# Patient Record
Sex: Male | Born: 1965
Health system: Southern US, Community
[De-identification: ages and names within clinical notes are randomized; demographics above are authoritative.]

## PROBLEM LIST (undated history)

## (undated) DIAGNOSIS — T8859XA Other complications of anesthesia, initial encounter: Secondary | ICD-10-CM

## (undated) DIAGNOSIS — K219 Gastro-esophageal reflux disease without esophagitis: Secondary | ICD-10-CM

## (undated) DIAGNOSIS — G4730 Sleep apnea, unspecified: Secondary | ICD-10-CM

## (undated) DIAGNOSIS — IMO0001 Reserved for inherently not codable concepts without codable children: Secondary | ICD-10-CM

## (undated) DIAGNOSIS — Z87442 Personal history of urinary calculi: Secondary | ICD-10-CM

## (undated) DIAGNOSIS — T4145XA Adverse effect of unspecified anesthetic, initial encounter: Secondary | ICD-10-CM

## (undated) DIAGNOSIS — K5792 Diverticulitis of intestine, part unspecified, without perforation or abscess without bleeding: Secondary | ICD-10-CM

## (undated) DIAGNOSIS — M1990 Unspecified osteoarthritis, unspecified site: Secondary | ICD-10-CM

## (undated) DIAGNOSIS — F419 Anxiety disorder, unspecified: Secondary | ICD-10-CM

## (undated) DIAGNOSIS — I10 Essential (primary) hypertension: Secondary | ICD-10-CM

## (undated) HISTORY — PX: BACK SURGERY: SHX140

## (undated) HISTORY — PX: COLON SURGERY: SHX602

## (undated) HISTORY — PX: KNEE SURGERY: SHX244

## (undated) HISTORY — PX: SHOULDER SURGERY: SHX246

## (undated) HISTORY — DX: Essential (primary) hypertension: I10

---

## 2011-06-19 ENCOUNTER — Observation Stay (HOSPITAL_COMMUNITY)
Admission: EM | Admit: 2011-06-19 | Discharge: 2011-06-20 | Disposition: A | Discharge status: Home / Self Care | Payer: 59 | Attending: Internal Medicine | Admitting: Internal Medicine

## 2011-06-19 ENCOUNTER — Emergency Department (HOSPITAL_COMMUNITY): Payer: 59

## 2011-06-19 ENCOUNTER — Other Ambulatory Visit: Payer: Self-pay

## 2011-06-19 ENCOUNTER — Encounter (HOSPITAL_COMMUNITY): Payer: Self-pay

## 2011-06-19 DIAGNOSIS — R079 Chest pain, unspecified: Secondary | ICD-10-CM

## 2011-06-19 DIAGNOSIS — K219 Gastro-esophageal reflux disease without esophagitis: Secondary | ICD-10-CM | POA: Insufficient documentation

## 2011-06-19 DIAGNOSIS — M25512 Pain in left shoulder: Secondary | ICD-10-CM | POA: Diagnosis present

## 2011-06-19 DIAGNOSIS — M25519 Pain in unspecified shoulder: Secondary | ICD-10-CM | POA: Insufficient documentation

## 2011-06-19 HISTORY — DX: Diverticulitis of intestine, part unspecified, without perforation or abscess without bleeding: K57.92

## 2011-06-19 HISTORY — DX: Other complications of anesthesia, initial encounter: T88.59XA

## 2011-06-19 HISTORY — DX: Adverse effect of unspecified anesthetic, initial encounter: T41.45XA

## 2011-06-19 HISTORY — DX: Reserved for inherently not codable concepts without codable children: IMO0001

## 2011-06-19 HISTORY — DX: Gastro-esophageal reflux disease without esophagitis: K21.9

## 2011-06-19 LAB — DIFFERENTIAL
Lymphs Abs: 1.7 10*3/uL (ref 0.7–4.0)
Monocytes Relative: 6 % (ref 3–12)
Neutro Abs: 6.4 10*3/uL (ref 1.7–7.7)
Neutrophils Relative %: 73 % (ref 43–77)

## 2011-06-19 LAB — LIPASE, BLOOD: Lipase: 26 U/L (ref 11–59)

## 2011-06-19 LAB — COMPREHENSIVE METABOLIC PANEL
ALT: 15 U/L (ref 0–53)
Albumin: 4.1 g/dL (ref 3.5–5.2)
Alkaline Phosphatase: 73 U/L (ref 39–117)
BUN: 23 mg/dL (ref 6–23)
Chloride: 103 mEq/L (ref 96–112)
GFR calc Af Amer: >90 mL/min (ref >90)
Glucose, Bld: 78 mg/dL (ref 70–99)
Potassium: 3.7 mEq/L (ref 3.5–5.1)
Sodium: 137 mEq/L (ref 135–145)
Total Bilirubin: 0.4 mg/dL (ref 0.3–1.2)

## 2011-06-19 LAB — CARDIAC PANEL(CRET KIN+CKTOT+MB+TROPI): Relative Index: INVALID (ref 0.0–2.5)

## 2011-06-19 LAB — CBC
Hemoglobin: 15.1 g/dL (ref 13.0–17.0)
RBC: 4.9 MIL/uL (ref 4.22–5.81)
WBC: 8.8 10*3/uL (ref 4.0–10.5)

## 2011-06-19 LAB — APTT: aPTT: 31 seconds (ref 24–37)

## 2011-06-19 LAB — PROTIME-INR: INR: 1 (ref 0.00–1.49)

## 2011-06-19 MED ORDER — HYDROCODONE-ACETAMINOPHEN 5-325 MG PO TABS
1.0000 | ORAL_TABLET | ORAL | Status: DC | PRN
  Administered 2011-06-19: 1 via ORAL
  Filled 2011-06-19: qty 1

## 2011-06-19 MED ORDER — SODIUM CHLORIDE 0.9 % IV SOLN
INTRAVENOUS | Status: DC
  Administered 2011-06-19: 20:00:00 via INTRAVENOUS

## 2011-06-19 MED ORDER — ASPIRIN 81 MG PO CHEW
324.0000 mg | CHEWABLE_TABLET | Freq: Once | ORAL | Status: AC
  Administered 2011-06-19: 324 mg via ORAL
  Filled 2011-06-19: qty 4

## 2011-06-19 MED ORDER — ASPIRIN 81 MG PO CHEW: 324.0000 mg | CHEWABLE_TABLET | Freq: Once | ORAL | Status: DC

## 2011-06-19 MED ORDER — NITROGLYCERIN 0.4 MG SL SUBL
0.4000 mg | SUBLINGUAL_TABLET | SUBLINGUAL | Status: DC | PRN
  Administered 2011-06-19 (×2): 0.4 mg via SUBLINGUAL
  Filled 2011-06-19: qty 75

## 2011-06-19 NOTE — ED Provider Notes (Signed)
History     CSN: [id number]  Arrival date & time 06/19/11  1814   First MD Initiated Contact with Patient 06/19/11 1902      Chief Complaint  Patient presents with  . Chest Pain    (Consider location/radiation/quality/duration/timing/severity/associated sxs/prior treatment) Patient is a 46 y.o. male presenting with chest pain. The history is provided by the patient.  Chest Pain Pertinent negatives for primary symptoms include no shortness of breath, no abdominal pain, no nausea and no vomiting.  Pertinent negatives for associated symptoms include no numbness and no weakness.   patient's had substernal to left sided chest pressure for the last 3 hours. Been constant. It began was at a meeting. No nausea vomiting. No fevers. No shortness of breath. His history of reflux, but that pain is not like that. His father began to have heart attacks in his 75s. He also has a brother that has had a heart attack. He also has left shoulder pain that has been chronic for a while. He states it was worse with trimming tree. Is not the same pain as the chest pain.  Past Medical History  Diagnosis Date  . Reflux   . Diverticulitis     Past Surgical History  Procedure Date  . Colon surgery   . Back surgery     No family history on file.  History  Substance Use Topics  . Smoking status: Never Smoker   . Smokeless tobacco: Not on file  . Alcohol Use: Yes      Review of Systems  Constitutional: Negative for activity change and appetite change.  HENT: Negative for neck stiffness.   Eyes: Negative for pain.  Respiratory: Negative for chest tightness and shortness of breath.   Cardiovascular: Positive for chest pain. Negative for leg swelling.  Gastrointestinal: Negative for nausea, vomiting, abdominal pain and diarrhea.  Genitourinary: Negative for flank pain.  Musculoskeletal: Negative for back pain.  Skin: Negative for rash.  Neurological: Negative for weakness, numbness and  headaches.  Psychiatric/Behavioral: Negative for behavioral problems.    Allergies  Review of patient's allergies indicates no known allergies.  Home Medications   Current Outpatient Rx  Name Route Sig Dispense Refill  . DIPHENHYDRAMINE HCL 25 MG PO TABS Oral Take 25 mg by mouth every 6 (six) hours as needed.    Marland Kitchen NAPROXEN SODIUM 220 MG PO TABS Oral Take 220 mg by mouth daily.      BP 110/76  Pulse 62  Temp(Src) 98.1 F (36.7 C) (Oral)  Resp 15  SpO2 97%  Physical Exam  Nursing note and vitals reviewed. Constitutional: He is oriented to person, place, and time. He appears well-developed and well-nourished.  HENT:  Head: Normocephalic and atraumatic.  Eyes: EOM are normal. Pupils are equal, round, and reactive to light.  Neck: Normal range of motion. Neck supple.  Cardiovascular: Normal rate, regular rhythm and normal heart sounds.   No murmur heard. Pulmonary/Chest: Effort normal and breath sounds normal.  Abdominal: Soft. Bowel sounds are normal. He exhibits no distension and no mass. There is no tenderness. There is no rebound and no guarding.  Musculoskeletal: Normal range of motion. He exhibits no edema.  Neurological: He is alert and oriented to person, place, and time. No cranial nerve deficit.  Skin: Skin is warm and dry.  Psychiatric: He has a normal mood and affect.    ED Course  Procedures (including critical care time)   Labs Reviewed  CBC  DIFFERENTIAL  COMPREHENSIVE METABOLIC PANEL  LIPASE, BLOOD  CARDIAC PANEL(CRET KIN+CKTOT+MB+TROPI)  PROTIME-INR  APTT   Dg Chest 2 View  06/19/2011  *RADIOLOGY REPORT*  Clinical Data: Pain for 1 week  CHEST - 2 VIEW  Comparison: None.  Findings:  The heart size and mediastinal contours are within normal limits.  Both lungs are clear.  The visualized skeletal structures are unremarkable except for mild thoracic scoliosis convex right.  IMPRESSION: No active cardiopulmonary disease.  Original Report Authenticated By:  Elsie Stain, M.D.   Dg Shoulder Left  06/19/2011  *RADIOLOGY REPORT*  Clinical Data: Pain for 1 week  LEFT SHOULDER - 2+ VIEW  Comparison:  None.  Findings:  There is no evidence of fracture or dislocation.  There is no evidence of arthropathy or other focal bone abnormality. Soft tissues are unremarkable.  IMPRESSION: Negative.  Original Report Authenticated By: Elsie Stain, M.D.     1. Chest pain      Date: 06/19/2011  Rate: 79  Rhythm: normal sinus rhythm  QRS Axis: normal  Intervals: normal  ST/T Wave abnormalities: normal  Conduction Disutrbances:none  Narrative Interpretation:   Old EKG Reviewed: none available    MDM  Chest pain.relieved by nitroglycerin. His father had his first heart attack in his 56s. EKG is reassuring. Enzymes are negative. Pain-free now. He'll be admitted to the hospital by internal medicine. I discussed initially with cardiology requested a medicine admission         Juliet Rude. Rubin Payor, MD 06/19/11 2147

## 2011-06-19 NOTE — Consult Note (Signed)
Referring Physician: ZOXWRUE Primary Physician: None Primary Cardiologist: None Reason for Consultation: Chest pain  HPI: 58 YOWM with a family history significant for premature CAD (dad with MI in 73s) who presents for evaluation of left sided chest pain and shoulder pain.  He reports 3-4 days of a dull ache over his left shoulder; this was exacerbated by yardwork this weekend.  Today, at 430pm he had 3 hours of left sided chest pain described as a dull ache of moderate intensity.  There were no associated signs or symptoms.  He went to urgent care and was give NTG which relieved his pain.  Currently, he is chest pain free.     Past Medical History  Diagnosis Date  . Reflux   . Diverticulitis     History   Social History  . Marital Status: Married    Spouse Name: N/A    Number of Children: N/A  . Years of Education: N/A   Occupational History  . Not on file.   Social History Main Topics  . Smoking status: Never Smoker   . Smokeless tobacco: Not on file  . Alcohol Use: Yes  . Drug Use: No  . Sexually Active:    Other Topics Concern  . Not on file   Social History Narrative  . No narrative on file    Family History  Problem Relation Age of Onset  . Breast cancer Mother   . Rectal cancer Mother   . Deep vein thrombosis Mother   . Stroke Father   . Heart disease Father 41  . Heart disease Brother 50    Medications Prior to Admission  Medication Dose Route Frequency Provider Last Rate Last Dose  . 0.9 %  sodium chloride infusion   Intravenous Continuous Juliet Rude. Rubin Payor, MD 125 mL/hr at 06/19/11 1939    . aspirin chewable tablet 324 mg  324 mg Oral Once [employer] Pickering, MD   324 mg at 06/19/11 1939  . HYDROcodone-acetaminophen (NORCO) 5-325 MG per tablet 1-2 tablet  1-2 tablet Oral Q4H PRN Therisa Doyne, MD   1 tablet at 06/19/11 2318  . nitroGLYCERIN (NITROSTAT) SL tablet 0.4 mg  0.4 mg Sublingual Q5 min PRN Juliet Rude. Pickering, MD   0.4 mg at 06/19/11  1945  . DISCONTD: aspirin chewable tablet 324 mg  324 mg Oral Once [employer]. Rubin Payor, MD       No current outpatient prescriptions on file as of 06/19/2011.    No Known Allergies  Review of Systems: All systems reviewed and are negative except as mentioned above in the history of present illness.    PHYSICAL EXAM: Filed Vitals:   06/19/11 2323  BP:   Pulse:   Temp: 97.7 F (36.5 C)  Resp:    GENERAL: No acute distress.   HEENT: Normocephalic, atraumatic.  Oropharynx is pink and moist without lesions.  NECK: Supple, no LAD, no JVD, no masses. CV: Regular rate and rhythm with no murmurs, rubs, or gallops.   LUNGS: Clear to auscultation bilaterally.   ABDOMEN: +BS, soft, nontender, nondistended.  EXTREMITIES: No clubbing, cyanosis, or edema.   NEURO: AO x 3, no focal deficits. PYSCH: Normal affect. SKIN: No rashes.     ECG: Sinus rhythm with normal STTW and no signs of ischemia.   Results for orders placed during the hospital encounter of 06/19/11 (from the past 24 hour(s))  CBC     Status: Normal   Collection Time   06/19/11  7:13 PM  Component Value Range   WBC 8.8  4.0 - 10.5 (K/uL)   RBC 4.90  4.22 - 5.81 (MIL/uL)   Hemoglobin 15.1  13.0 - 17.0 (g/dL)   HCT 40.9  81.1 - 91.4 (%)   MCV 89.0  78.0 - 100.0 (fL)   MCH 30.8  26.0 - 34.0 (pg)   MCHC 34.6  30.0 - 36.0 (g/dL)   RDW 78.2  95.6 - 21.3 (%)   Platelets 230  150 - 400 (K/uL)  DIFFERENTIAL     Status: Normal   Collection Time   06/19/11  7:13 PM      Component Value Range   Neutrophils Relative 73  43 - 77 (%)   Neutro Abs 6.4  1.7 - 7.7 (K/uL)   Lymphocytes Relative 20  12 - 46 (%)   Lymphs Abs 1.7  0.7 - 4.0 (K/uL)   Monocytes Relative 6  3 - 12 (%)   Monocytes Absolute 0.5  0.1 - 1.0 (K/uL)   Eosinophils Relative 1  0 - 5 (%)   Eosinophils Absolute 0.1  0.0 - 0.7 (K/uL)   Basophils Relative 0  0 - 1 (%)   Basophils Absolute 0.0  0.0 - 0.1 (K/uL)  COMPREHENSIVE METABOLIC PANEL     Status: Normal     Collection Time   06/19/11  7:13 PM      Component Value Range   Sodium 137  135 - 145 (mEq/L)   Potassium 3.7  3.5 - 5.1 (mEq/L)   Chloride 103  96 - 112 (mEq/L)   CO2 23  19 - 32 (mEq/L)   Glucose, Bld 78  70 - 99 (mg/dL)   BUN 23  6 - 23 (mg/dL)   Creatinine, Ser 0.86  0.50 - 1.35 (mg/dL)   Calcium 9.3  8.4 - 57.8 (mg/dL)   Total Protein 7.1  6.0 - 8.3 (g/dL)   Albumin 4.1  3.5 - 5.2 (g/dL)   AST 12  0 - 37 (U/L)   ALT 15  0 - 53 (U/L)   Alkaline Phosphatase 73  39 - 117 (U/L)   Total Bilirubin 0.4  0.3 - 1.2 (mg/dL)   GFR calc non Af Amer >90  >90 (mL/min)   GFR calc Af Amer >90  >90 (mL/min)  LIPASE, BLOOD     Status: Normal   Collection Time   06/19/11  7:13 PM      Component Value Range   Lipase 26  11 - 59 (U/L)  CARDIAC PANEL(CRET KIN+CKTOT+MB+TROPI)     Status: Normal   Collection Time   06/19/11  7:13 PM      Component Value Range   Total CK 58  7 - 232 (U/L)   CK, MB 1.7  0.3 - 4.0 (ng/mL)   Troponin I <0.30  <0.30 (ng/mL)   Relative Index RELATIVE INDEX IS INVALID  0.0 - 2.5   PROTIME-INR     Status: Normal   Collection Time   06/19/11  7:13 PM      Component Value Range   Prothrombin Time 13.4  11.6 - 15.2 (seconds)   INR 1.00  0.00 - 1.49   APTT     Status: Normal   Collection Time   06/19/11  7:13 PM      Component Value Range   aPTT 31  24 - 37 (seconds)   Dg Chest 2 View  06/19/2011  *RADIOLOGY REPORT*  Clinical Data: Pain for 1 week  CHEST - 2 VIEW  Comparison: None.  Findings:  The heart size and mediastinal contours are within normal limits.  Both lungs are clear.  The visualized skeletal structures are unremarkable except for mild thoracic scoliosis convex right.  IMPRESSION: No active cardiopulmonary disease.  Original Report Authenticated By: Elsie Stain, M.D.   Dg Shoulder Left  06/19/2011  *RADIOLOGY REPORT*  Clinical Data: Pain for 1 week  LEFT SHOULDER - 2+ VIEW  Comparison:  None.  Findings:  There is no evidence of fracture or  dislocation.  There is no evidence of arthropathy or other focal bone abnormality. Soft tissues are unremarkable.  IMPRESSION: Negative.  Original Report Authenticated By: Elsie Stain, M.D.   ASSESSMENT and PLAN:  34 YOWM with a family history significant for premature CAD (dad with MI in 83s) who presents for evaluation of left sided chest pain and shoulder pain and was found to have a normal EKG and normal cardiac enzymes times one set.  1: Chest pain - his history is atypical for ACS.  Other etiologies included GERD, esophageal spasm, chest wall pain or stress.  Recommend tele monitoring and rule out with cardiac enzymes.  Given his family history, recommend exercise nuclear stress test in the morning prior to discharge.  NPO after midnight.  Check lipid panel.  Continue aspirin.   2: Shoulder pain - this seems musculoskeletal in etiology.  Continue empiric NSAIDS.  Consider MRI as outpatient if this doesn't improve.   3: [facility] will follow in consultation  Gwendalyn Ege, Cardiology  Level 5 consult

## 2011-06-19 NOTE — ED Notes (Signed)
Pt reports decrease in pain from 3 to 0 after nitro SL x 2. Plan of care is updated with verbal understanding, VSS, INAD, MAE x4, skin w/d and resp e/u. Will continue to monitor pt, pt is awaiting test results for disposition.

## 2011-06-19 NOTE — H&P (Signed)
PCP:  Originally form [place] no PCP   Chief Complaint:   Chest pain  HPI: Andrew Hooper is a 46 y.o. male   has a past medical history of Reflux and Diverticulitis.   Presented with   Chest pain started at 4:30 pm while in the meeting and was referred to the ER from urgent care. Pain felt dull achy right under left chest area.  Pain relieved with nitro. No SOB, felt a bit flushed. No nausea, no presyncope, no diaphoresis. Some extra stress lately. No strenuous activity. Have been on low carb diet and lost 50 lb. Had negative stress test 5 years ago. Of note he has history of Left Shoulder pain that gets worse occasionally but has been bothering him on and off for past 2 years. For past week that Pain was worse with movement. He is concerned as his grand-father had RA.  Review of Systems:    Pertinent positives include:headaches, chest pain  Constitutional:  No weight loss, night sweats, Fevers, chills, fatigue.  HEENT:  No  Difficulty swallowing,Tooth/dental problems,Sore throat,  No sneezing, itching, ear ache, nasal congestion, post nasal drip,  Cardio-vascular:  No , Orthopnea, PND, anasarca, dizziness, palpitations.no Bilateral lower extremity swelling  GI:  No heartburn, indigestion, abdominal pain, nausea, vomiting, diarrhea, change in bowel habits, loss of appetite, melena, blood in stool, hematoemesis Resp:  no shortness of breath at rest. No dyspnea on exertion, No excess mucus, no productive cough, No non-productive cough, No coughing up of blood.No change in color of mucus.No wheezing.No chest wall deformity  Skin:  no rash or lesions.  GU:  no dysuria, change in color of urine, no urgency or frequency. No flank pain.  Musculoskeletal:  No joint pain or swelling. No decreased range of motion. No back pain.  Psych:  No change in mood or affect. No depression or anxiety. No memory loss.  Neuro: no localizing neurological complaints, no tingling, no weakness, no  double vision, no gait abnormality, no slurred speech   Otherwise ROS are negative except for above, 10 systems were reviewed  Past Medical History: Past Medical History  Diagnosis Date  . Reflux   . Diverticulitis    Past Surgical History  Procedure Date  . Colon surgery   . Back surgery      Medications: Prior to Admission medications   Medication Sig Start Date End Date Taking? Authorizing Provider  diphenhydrAMINE (BENADRYL) 25 MG tablet Take 25 mg by mouth every 6 (six) hours as needed.   Yes Historical Provider, MD  naproxen sodium (ANAPROX) 220 MG tablet Take 220 mg by mouth daily.   Yes Historical Provider, MD    Allergies:  No Known Allergies  Social History:  Ambulatory independently  Lives at home   reports that he has never smoked. He does not have any smokeless tobacco history on file. He reports that he drinks alcohol. He reports that he does not use illicit drugs.   Family History: family history includes Breast cancer in his mother; Deep vein thrombosis in his mother; Heart disease (age of onset:40) in his father; Heart disease (age of onset:50) in his brother; Rectal cancer in his mother; and Stroke in his father.    Physical Exam: Patient Vitals for the past 24 hrs:  BP Temp Temp src Pulse Resp SpO2  06/19/11 2112 110/76 mmHg - - 62  15  97 %  06/19/11 1943 115/69 mmHg - - 79  14  96 %  06/19/11 1930 130/83 mmHg - -  70  15  97 %  06/19/11 1823 - 98.1 F (36.7 C) Oral - - -  06/19/11 1821 127/87 mmHg - - 83  18  99 %    1. General:  in No Acute distress 2. Psychological: Alert and Oriented 3. Head/ENT:   Moist  Mucous Membranes                          Head Non traumatic, neck supple                          Normal  Dentition 4. SKIN: normalSkin turgor,  Skin clean Dry and intact no rash 5. Heart: Regular rate and rhythm no Murmur, Rub or gallop 6. Lungs: Clear to auscultation bilaterally, no wheezes or crackles   7. Abdomen: Soft, non-tender,  Non distended 8. Lower extremities: no clubbing, cyanosis, or edema 9. Neurologically Grossly intact, moving all 4 extremities equally 10. MSK: Normal range of motion, no pain with movement of left shoulder  body mass index is unknown because there is no height or weight on file.   Labs on Admission:   [facility] 06/19/11 1913  NA 137  K 3.7  CL 103  CO2 23  GLUCOSE 78  BUN 23  CREATININE 0.87  CALCIUM 9.3  MG --  PHOS --    Basename 06/19/11 1913  AST 12  ALT 15  ALKPHOS 73  BILITOT 0.4  PROT 7.1  ALBUMIN 4.1    Basename 06/19/11 1913  LIPASE 26  AMYLASE --    Basename 06/19/11 1913  WBC 8.8  NEUTROABS 6.4  HGB 15.1  HCT 43.6  MCV 89.0  PLT 230    Basename 06/19/11 1913  CKTOTAL 58  CKMB 1.7  CKMBINDEX --  TROPONINI <0.30   No results found for this basename: TSH,T4TOTAL,FREET3,T3FREE,THYROIDAB in the last 72 hours No results found for this basename: VITAMINB12:2,FOLATE:2,FERRITIN:2,TIBC:2,IRON:2,RETICCTPCT:2 in the last 72 hours No results found for this basename: HGBA1C    CrCl is unknown because there is no height on file for the current visit. ABG No results found for this basename: phart, pco2, po2, hco3, tco2, acidbasedef, o2sat     No results found for this basename: DDIMER     Other results: Lipase 26  I have pearsonaly reviewed this: ECG REPORT  Rate:79  Rhythm: NSR ST&T Change: none       Radiological Exams on Admission: Dg Chest 2 View  06/19/2011  *RADIOLOGY REPORT*  Clinical Data: Pain for 1 week  CHEST - 2 VIEW  Comparison: None.  Findings:  The heart size and mediastinal contours are within normal limits.  Both lungs are clear.  The visualized skeletal structures are unremarkable except for mild thoracic scoliosis convex right.  IMPRESSION: No active cardiopulmonary disease.  Original Report Authenticated By: Elsie Stain, M.D.   Dg Shoulder Left  06/19/2011  *RADIOLOGY REPORT*  Clinical Data: Pain for 1 week  LEFT  SHOULDER - 2+ VIEW  Comparison:  None.  Findings:  There is no evidence of fracture or dislocation.  There is no evidence of arthropathy or other focal bone abnormality. Soft tissues are unremarkable.  IMPRESSION: Negative.  Original Report Authenticated By: Elsie Stain, M.D.    Assessment/Plan  46 year old gentleman with history of GERD and family history of early onset heart disease here with chest pain relieved by nitroglycerin.  Present on Admission:  .Chest pain -  given  risk factors will admit, monitor on telemetry, cycle cardiac enzymes, obtain serial ECG. Further risk stratify with lipid panel, hgA1C, obtain TSH. Make sure patient is on Aspirin. Further treatment based on the currently pending results.  Given risk factors would notify cardiology to see patient as a consult, have spoken to [facility] cardiology. Will make him NPO for possible stress test in AM. .Left shoulder pain - imaging unremarkable. Pain control for now. This could be followed up as an outpatient.   Prophylaxis:  Lovenox, Protonix  CODE STATUS: FULL CODE   Shawntell Dixson 06/19/2011, 10:17 PM

## 2011-06-19 NOTE — ED Notes (Signed)
Admitting MD at bedside, pt awaiting inpt beds assignment.  

## 2011-06-19 NOTE — ED Notes (Signed)
Pt presents with onset of L sided chest pain that began today while seated in a meeting.  Pt reports L shoulder pain since the weekend after trimming trees, but reports shoulder pain before he began trimming.  Pt denies any shortness of breath or nausea.  Pt reports h/o reflux, but pain is in epigastric area.  Father began to have MIs in his 9s.

## 2011-06-20 ENCOUNTER — Other Ambulatory Visit: Payer: Self-pay

## 2011-06-20 ENCOUNTER — Encounter (HOSPITAL_COMMUNITY): Payer: Self-pay | Admitting: General Practice

## 2011-06-20 DIAGNOSIS — R079 Chest pain, unspecified: Secondary | ICD-10-CM

## 2011-06-20 LAB — COMPREHENSIVE METABOLIC PANEL
BUN: 24 mg/dL — ABNORMAL HIGH (ref 6–23)
CO2: 27 mEq/L (ref 19–32)
Calcium: 9.1 mg/dL (ref 8.4–10.5)
Creatinine, Ser: 0.93 mg/dL (ref 0.50–1.35)
GFR calc Af Amer: >90 mL/min (ref >90)
GFR calc non Af Amer: >90 mL/min (ref >90)
Glucose, Bld: 79 mg/dL (ref 70–99)

## 2011-06-20 LAB — CARDIAC PANEL(CRET KIN+CKTOT+MB+TROPI)
CK, MB: 1.6 ng/mL (ref 0.3–4.0)
Relative Index: INVALID (ref 0.0–2.5)
Relative Index: INVALID (ref 0.0–2.5)
Total CK: 52 U/L (ref 7–232)
Total CK: 48 U/L (ref 7–232)
Total CK: 48 U/L (ref 7–232)
Troponin I: <0.3 ng/mL (ref <0.30)

## 2011-06-20 LAB — LIPID PANEL
Cholesterol: 143 mg/dL (ref 0–200)
LDL Cholesterol: 88 mg/dL (ref 0–99)
Triglycerides: 106 mg/dL (ref <150)

## 2011-06-20 LAB — CBC
Hemoglobin: 14.1 g/dL (ref 13.0–17.0)
RBC: 4.68 MIL/uL (ref 4.22–5.81)

## 2011-06-20 LAB — HEMOGLOBIN A1C: Hgb A1c MFr Bld: 5.3 % (ref <5.7)

## 2011-06-20 MED ORDER — PANTOPRAZOLE SODIUM 40 MG PO TBEC: 40.0000 mg | DELAYED_RELEASE_TABLET | Freq: Every day | ORAL | Status: DC

## 2011-06-20 MED ORDER — PANTOPRAZOLE SODIUM 40 MG PO TBEC
40.0000 mg | DELAYED_RELEASE_TABLET | Freq: Every day | ORAL | Status: DC
  Administered 2011-06-20: 40 mg via ORAL
  Filled 2011-06-20: qty 1

## 2011-06-20 MED ORDER — ALBUTEROL SULFATE (5 MG/ML) 0.5% IN NEBU: 2.5000 mg | INHALATION_SOLUTION | RESPIRATORY_TRACT | Status: DC | PRN

## 2011-06-20 MED ORDER — ASPIRIN 81 MG PO TBEC: 81.0000 mg | DELAYED_RELEASE_TABLET | Freq: Every day | ORAL | Status: AC

## 2011-06-20 MED ORDER — ONDANSETRON HCL 4 MG/2ML IJ SOLN: 4.0000 mg | Freq: Four times a day (QID) | INTRAMUSCULAR | Status: DC | PRN

## 2011-06-20 MED ORDER — GUAIFENESIN-DM 100-10 MG/5ML PO SYRP: 5.0000 mL | ORAL_SOLUTION | ORAL | Status: DC | PRN

## 2011-06-20 MED ORDER — NITROGLYCERIN 0.4 MG SL SUBL: 0.4000 mg | SUBLINGUAL_TABLET | SUBLINGUAL | Status: DC | PRN

## 2011-06-20 MED ORDER — ASPIRIN EC 81 MG PO TBEC
81.0000 mg | DELAYED_RELEASE_TABLET | Freq: Every day | ORAL | Status: DC
  Administered 2011-06-20: 81 mg via ORAL
  Filled 2011-06-20: qty 1

## 2011-06-20 MED ORDER — ACETAMINOPHEN 325 MG PO TABS: 650.0000 mg | ORAL_TABLET | Freq: Four times a day (QID) | ORAL | Status: DC | PRN

## 2011-06-20 MED ORDER — ONDANSETRON HCL 4 MG PO TABS: 4.0000 mg | ORAL_TABLET | Freq: Four times a day (QID) | ORAL | Status: DC | PRN

## 2011-06-20 MED ORDER — SODIUM CHLORIDE 0.9 % IV SOLN
INTRAVENOUS | Status: DC
  Administered 2011-06-20 (×2): via INTRAVENOUS

## 2011-06-20 MED ORDER — HYDROCODONE-ACETAMINOPHEN 5-325 MG PO TABS: 1.0000 | ORAL_TABLET | Freq: Four times a day (QID) | ORAL | Status: AC | PRN

## 2011-06-20 MED ORDER — ACETAMINOPHEN 650 MG RE SUPP: 650.0000 mg | Freq: Four times a day (QID) | RECTAL | Status: DC | PRN

## 2011-06-20 MED ORDER — ALUM & MAG HYDROXIDE-SIMETH 200-200-20 MG/5ML PO SUSP
30.0000 mL | Freq: Four times a day (QID) | ORAL | Status: DC | PRN
Start: 2011-06-20 — End: 2011-06-20

## 2011-06-20 MED ORDER — ENOXAPARIN SODIUM 40 MG/0.4ML ~~LOC~~ SOLN
40.0000 mg | SUBCUTANEOUS | Status: DC
  Administered 2011-06-20: 40 mg via SUBCUTANEOUS
  Filled 2011-06-20: qty 0.4

## 2011-06-20 MED ORDER — MORPHINE SULFATE 2 MG/ML IJ SOLN: 2.0000 mg | INTRAMUSCULAR | Status: DC | PRN

## 2011-06-20 NOTE — Discharge Summary (Signed)
Physician Discharge Summary  Patient ID: Andrew Hooper MRN: [id number] DOB/AGE: 1965-06-21 46 y.o.  Admit date: 06/19/2011 Discharge date: 06/20/2011  Primary Care Physician:  No primary provider on file.  Discharge Diagnoses:   Present on Admission:  .Chest pain with typical and atypical features : Resolved  .Left shoulder pain: Resolved GERD  Consults:  [facility], Sharrell Ku, M.D.   Discharge Medications: Current Discharge Medication List    START taking these medications   Details  aspirin EC 81 MG EC tablet Take 1 tablet (81 mg total) by mouth daily. Qty: 30 tablet, Refills: 3    HYDROcodone-acetaminophen (NORCO) 5-325 MG per tablet Take 1-2 tablets by mouth every 6 (six) hours as needed. Qty: 30 tablet, Refills: 0    nitroGLYCERIN (NITROSTAT) 0.4 MG SL tablet Place 1 tablet (0.4 mg total) under the tongue every 5 (five) minutes as needed for chest pain. Qty: 30 tablet, Refills: 3    pantoprazole (PROTONIX) 40 MG tablet Take 1 tablet (40 mg total) by mouth daily at 12 noon. Qty: 30 tablet, Refills: 3      CONTINUE these medications which have NOT CHANGED   Details  diphenhydrAMINE (BENADRYL) 25 MG tablet Take 25 mg by mouth every 6 (six) hours as needed.    naproxen sodium (ANAPROX) 220 MG tablet Take 220 mg by mouth daily.         Brief H and P: For complete details please refer to admission H and P, but in brief patient is a 46 year old male with a history of GERD and diverticulitis presented with chest pain while in a meeting and was referred to the emergency room from urgent care. Patient denied any shortness of breath felt a bit flushed, denied any nausea or any presyncope or diaphoresis. Patient had a negative stress test 5 years ago. He also has a history of left shoulder pain that has been bothering him on and off for last 2 years. Patient was admitted for further workup.  Hospital Course:  Principal Problem:  *Chest pain: Resolved Patient was  admitted due to typical and atypical features of his chest pain which was resolved during hospitalization. 3 sets of cardiac enzymes were negative for ACS. Chest x-ray and left shoulder x-ray was negative. Cardiology was consulted, per cardiology recommendations, patient was discharged home due to unavailability of stress test today and stress test was scheduled on 06/26/2011 at 8:45 AM with [facility]. Patient was recommended to refrain from strenuous exercises, continue aspirin, when necessary nitroglycerin for chest pain, Protonix for GERD.   Active Problems:  Left shoulder pain: Improved   Day of Discharge BP 103/68  Pulse 57  Temp(Src) 97.6 F (36.4 C) (Oral)  Resp 18  Ht 6' (1.829 m)  Wt 104.7 kg (230 lb 13.2 oz)  BMI 31.31 kg/m2  SpO2 99%  Physical Exam: General: Alert and awake oriented x3 not in any acute distress. HEENT: anicteric sclera, pupils reactive to light and accommodation CVS: S1-S2 clear no murmur rubs or gallops Chest: clear to auscultation bilaterally, no wheezing rales or rhonchi Abdomen: soft nontender, nondistended, normal bowel sounds, no organomegaly Extremities: no cyanosis, clubbing or edema noted bilaterally Neuro: Cranial nerves II-XII intact, no focal neurological deficits   The results of significant diagnostics from this hospitalization (including imaging, microbiology, ancillary and laboratory) are listed below for reference.    LAB RESULTS: Basic Metabolic Panel:  Lab 06/20/11 2130 06/19/11 1913  NA 140 137  K 4.1 3.7  CL 106 103  CO2  27 23  GLUCOSE 79 78  BUN 24* 23  CREATININE 0.93 0.87  CALCIUM 9.1 9.3  MG 2.3 --  PHOS 4.3 --   Liver Function Tests:  Lab 06/20/11 0632 06/19/11 1913  AST 11 12  ALT 13 15  ALKPHOS 63 73  BILITOT 0.3 0.4  PROT 6.3 7.1  ALBUMIN 3.4* 4.1    Lab 06/19/11 1913  LIPASE 26  AMYLASE --   CBC:  Lab 06/20/11 0632 06/19/11 1913  WBC 6.3 8.8  NEUTROABS -- 6.4  HGB 14.1 15.1  HCT 42.5  43.6  MCV 90.8 --  PLT 202 230   Cardiac Enzymes:  Lab 06/20/11 1220 06/20/11 0632  CKTOTAL 48 48  CKMB 1.7 1.6  CKMBINDEX -- --  TROPONINI <0.30 <0.30    Significant Diagnostic Studies:  No results found.   Disposition and Follow-up: Discharge Orders    Future Appointments: Provider: Department: Dept Phone: Center:   06/26/2011 8:45 AM [facility]  None     Future Orders Please Complete By Expires   Diet - low sodium heart healthy      Increase activity slowly      Discharge instructions      Comments:   No strenous exercises until the stress test       DISPOSITION: Home  DIET:Heart healthy diet  ACTIVITY: Avoid strenuous exercise  TESTS THAT NEED FOLLOW-UP Stress test on January 23rd 2013 by [facility]ology  DISCHARGE FOLLOW-UP Follow-up Information    Follow up with [facility] on 06/26/2011. (8:45am. Please arrive early. Nothing to eat or drink  after midnight on the 21st.  Wear comfortable clothing and shoes as you will be walking on a treadmill.)    Contact information:   [address] [place] [address] [phone]         Time spent on Discharge: 35 minutes  Signed:  RAI,RIPUDEEP M.D. [facility] 06/20/2011, 1:50 PM

## 2011-06-20 NOTE — Progress Notes (Addendum)
Patient ID: Andrew Hooper, male   DOB: June 09, 1965, 46 y.o.   MRN: [id number] Subjective:  No additional chest pain.  Objective:  Vital Signs in the last 24 hours: Temp:  [97.6 F (36.4 C)-98.1 F (36.7 C)] 97.6 F (36.4 C) (01/17 0643) Pulse Rate:  [57-83] 57  (01/17 0643) Resp:  [14-18] 18  (01/17 0643) BP: (103-130)/(68-87) 103/68 mmHg (01/17 0643) SpO2:  [96 %-99 %] 99 % (01/17 0643) FiO2 (%):  [21 %] 21 % (01/17 0056) Weight:  [104.7 kg (230 lb 13.2 oz)] 104.7 kg (230 lb 13.2 oz) (01/17 0100)  Intake/Output from previous day: 01/16 0701 - 01/17 0700 In: 1283.3 [I.V.:1283.3] Out: -  Intake/Output from this shift: Total I/O In: -  Out: 500 [Urine:500]  Physical Exam: Well appearing NAD HEENT: Unremarkable Neck:  No JVD, no thyromegally Lungs:  Clear with no wheezes, rales, or rhonchi HEART:  Regular rate rhythm, no murmurs, no rubs, no clicks Abd:  Flat, positive bowel sounds, no organomegally, no rebound, no guarding Ext:  2 plus pulses, no edema, no cyanosis, no clubbing Skin:  No rashes no nodules Neuro:  CN II through XII intact, motor grossly intact  Lab Results:  Basename 06/20/11 0632 06/19/11 1913  WBC 6.3 8.8  HGB 14.1 15.1  PLT 202 230    Basename 06/20/11 0632 06/19/11 1913  NA 140 137  K 4.1 3.7  CL 106 103  CO2 27 23  GLUCOSE 79 78  BUN 24* 23  CREATININE 0.93 0.87    Basename 06/20/11 0632 06/20/11 0137  TROPONINI <0.30 <0.30   Hepatic Function Panel  Basename 06/20/11 0632  PROT 6.3  ALBUMIN 3.4*  AST 11  ALT 13  ALKPHOS 63  BILITOT 0.3  BILIDIR --  IBILI --    Basename 06/20/11 0632  CHOL 143   No results found for this basename: PROTIME in the last 72 hours  Imaging: Dg Chest 2 View  06/19/2011  *RADIOLOGY REPORT*  Clinical Data: Pain for 1 week  CHEST - 2 VIEW  Comparison: None.  Findings:  The heart size and mediastinal contours are within normal limits.  Both lungs are clear.  The visualized skeletal structures are  unremarkable except for mild thoracic scoliosis convex right.  IMPRESSION: No active cardiopulmonary disease.  Original Report Authenticated By: Elsie Stain, M.D.   Dg Shoulder Left  06/19/2011  *RADIOLOGY REPORT*  Clinical Data: Pain for 1 week  LEFT SHOULDER - 2+ VIEW  Comparison:  None.  Findings:  There is no evidence of fracture or dislocation.  There is no evidence of arthropathy or other focal bone abnormality. Soft tissues are unremarkable.  IMPRESSION: Negative.  Original Report Authenticated By: Elsie Stain, M.D.    Cardiac Studies: Tele - NSR  Assessment/Plan:   1. Chest pain - his symptoms are both typical and atypical and have now resolved. Will allow him to be discharged home as we are unable to schedule stress test today and keeping the patient an additional day in the hospital low yield. I have asked that he not undergo any strenuous activity. Will ask him to walk in the halls. If no chest pain then he will be discharged later this morning.will discharge on ASA. His stress test is scheduled in our office on Tues., January 22 at 11:30 a.m. Instructions placed in followup section of discharge information.   Lewayne Bunting 06/20/2011, 9:28 AM

## 2011-06-20 NOTE — Progress Notes (Signed)
Retro ur ins review. 

## 2011-06-20 NOTE — ED Notes (Signed)
Pt denies any pain at this time. Plan of care is updated with verbal understanding, pt is awaiting transport to inpt bed assignment.

## 2011-06-20 NOTE — Progress Notes (Signed)
Due to patient preference of dates, stress myoview was changed to 06/26/11 at 8:45am.  Ronie Spies PA-C

## 2011-06-24 ENCOUNTER — Other Ambulatory Visit (HOSPITAL_COMMUNITY): Payer: Self-pay | Admitting: Internal Medicine

## 2011-06-24 DIAGNOSIS — R079 Chest pain, unspecified: Secondary | ICD-10-CM

## 2011-06-25 ENCOUNTER — Telehealth: Payer: Self-pay | Admitting: Internal Medicine

## 2011-06-25 ENCOUNTER — Encounter (HOSPITAL_COMMUNITY): Payer: 59 | Admitting: Radiology

## 2011-06-25 NOTE — Telephone Encounter (Signed)
Will forward to Dr Ladona Ridgel to call patient to discuss the need for GXT  His insurance will not cover a Myoveiw

## 2011-06-25 NOTE — Telephone Encounter (Signed)
lmom for patient that Dr Ladona Ridgel advises that he do regular GXT and can call me back with any further questions

## 2011-06-25 NOTE — Telephone Encounter (Signed)
Pt wants to talk to Dr. Ladona Ridgel regarding the treadmill and make sure it is worth it to take it

## 2011-06-26 ENCOUNTER — Encounter: Payer: 59 | Admitting: Physician Assistant

## 2011-06-26 ENCOUNTER — Encounter (HOSPITAL_COMMUNITY): Payer: 59 | Admitting: Radiology

## 2013-08-12 ENCOUNTER — Encounter: Payer: Self-pay | Admitting: Interventional Cardiology

## 2013-09-21 ENCOUNTER — Ambulatory Visit: Payer: 59 | Admitting: Interventional Cardiology

## 2013-10-02 ENCOUNTER — Encounter: Payer: Self-pay | Admitting: *Deleted

## 2013-10-27 ENCOUNTER — Encounter: Payer: Self-pay | Admitting: Interventional Cardiology

## 2013-10-27 ENCOUNTER — Ambulatory Visit (INDEPENDENT_AMBULATORY_CARE_PROVIDER_SITE_OTHER): Payer: 59 | Admitting: Interventional Cardiology

## 2013-10-27 VITALS — BP 140/92 | HR 91 | Ht 72.0 in | Wt 256.0 lb

## 2013-10-27 DIAGNOSIS — R079 Chest pain, unspecified: Secondary | ICD-10-CM

## 2013-10-27 DIAGNOSIS — Z8249 Family history of ischemic heart disease and other diseases of the circulatory system: Secondary | ICD-10-CM

## 2013-10-27 DIAGNOSIS — E669 Obesity, unspecified: Secondary | ICD-10-CM | POA: Insufficient documentation

## 2013-10-27 NOTE — Progress Notes (Signed)
Patient ID: Andrew Hooper, male   DOB: 12-20-1965, 48 y.o.   MRN: [id number]    [facility], [address] [place], [place]  [address] Phone: [phone] Fax:  [phone]  Date:  10/27/2013   ID:  Andrew Hooper, DOB 14-Jul-1965, MRN [id number]  PCP:  No primary provider on file.      History of Present Illness: Andrew Hooper is a 48 y.o. male who has had CP several times in the past. He has had a negative stress test several years ago. A nuclear stress test was recommended but denied by insurance. He has had a recurrence of the pain more recently. It is not a sharp pain. He increased PPI and has had some improvement. Pain had been daily.  Father had a stroke and MI in 63s. Brothers have had heart disease. Both older brothers had stents, age 39.  Stress test in 2014 was negative.  He takes aleve for joint pain on occasion.  No significant chest pain recently. Symptoms better after increasing Prilosec.   Wt Readings from Last 3 Encounters:  10/27/13 256 lb (116.121 kg)  06/20/11 230 lb 13.2 oz (104.7 kg)     Past Medical History  Diagnosis Date  . Reflux   . Diverticulitis   . Complication of anesthesia     rash afterwords"  . GERD (gastroesophageal reflux disease)     Current Outpatient Prescriptions  Medication Sig Dispense Refill  . acetaminophen (TYLENOL) 500 MG tablet Take 500 mg by mouth every 6 (six) hours as needed.      . diphenhydrAMINE (BENADRYL) 25 MG tablet Take 25 mg by mouth every 6 (six) hours as needed.      . naproxen sodium (ANAPROX) 220 MG tablet Take 220 mg by mouth daily.      Marland Kitchen omeprazole (PRILOSEC) 20 MG capsule Take 20 mg by mouth daily.       No current facility-administered medications for this visit.    Allergies:    Allergies  Allergen Reactions  . Sulfur     Social History:  The patient  reports that he has never smoked. He has never used smokeless tobacco. He reports that he drinks alcohol. He reports that he does not use illicit  drugs.   Family History:  The patient's family history includes Breast cancer in his mother; Deep vein thrombosis in his mother; Heart disease (age of onset: 79) in his father; Heart disease (age of onset: 14) in his brother; Rectal cancer in his mother; Stroke in his father.   ROS:  Please see the history of present illness.  No nausea, vomiting.  No fevers, chills.  No focal weakness.  No dysuria.    All other systems reviewed and negative.   PHYSICAL EXAM: VS:  BP 140/92  Pulse 91  Ht 6' (1.829 m)  Wt 256 lb (116.121 kg)  BMI 34.71 kg/m2 Well nourished, well developed, in no acute distress HEENT: normal Neck: no JVD, no carotid bruits Cardiac:  normal S1, S2; RRR;  Lungs:  clear to auscultation bilaterally, no wheezing, rhonchi or rales Abd: soft, nontender, no hepatomegaly Ext: no edema Skin: warm and dry Neuro:   no focal abnormalities noted  EKG:  normal     ASSESSMENT AND PLAN:  Chest pain, unspecified  Notes: Several atypical features for chest pain. Symptom is improving. Negative exercise treadmill test in 2014. Essentially normal echocardiogram to evaluate for structural heart disease in 2014 .  Symptoms improved with additional PPI.  2. Family history of ischemic heart disease   we discussed Starting Aspirin EC Lo-Dose Tablet Delayed Release, 81 MG, 1 tablet, Orally, Once a day, 30 day(s), 30.  I recommended this last year but he did not start it. He does like to take his Aleve. Given that he take the NSAID, well pulled off on the regular aspirin. Cholesterol to be checked soon by PCP.  Notes: Several family members have had premature coronary artery disease. We talked about risk factor modification including dietary changes weight loss. He needs to try to exercise more despite his joint problems.  3. Obesity, unspecified  Notes: Weight loss will be a helpful long-term goal for him as well. We spoke about lifestyle, modifications   Signed, Mina Marble, MD,  [facility] 10/27/2013 4:16 PM

## 2013-10-27 NOTE — Patient Instructions (Signed)
Your physician recommends that you continue on your current medications as directed. Please refer to the Current Medication list given to you today.  Have Dr. Modena Morrow fax labs to [place] at [phone].  Your physician wants you to follow-up in: 1 year with Dr. Irish Lack. You will receive a reminder letter in the mail two months in advance. If you don't receive a letter, please call our office to schedule the follow-up appointment.

## 2018-01-28 ENCOUNTER — Encounter: Payer: Self-pay | Admitting: *Deleted

## 2018-01-28 ENCOUNTER — Encounter: Payer: Self-pay | Admitting: Pulmonary Disease

## 2018-01-29 ENCOUNTER — Encounter: Payer: Self-pay | Admitting: Pulmonary Disease

## 2018-01-29 ENCOUNTER — Ambulatory Visit (INDEPENDENT_AMBULATORY_CARE_PROVIDER_SITE_OTHER): Payer: 59 | Admitting: Pulmonary Disease

## 2018-01-29 VITALS — BP 122/86 | HR 87 | Ht 71.0 in | Wt 269.0 lb

## 2018-01-29 DIAGNOSIS — G4733 Obstructive sleep apnea (adult) (pediatric): Secondary | ICD-10-CM

## 2018-01-29 NOTE — Progress Notes (Signed)
Karmine Kauer    [id number]    18-Nov-1965  Primary Care Physician:Spear, Lynelle Smoke, MD (Inactive)  Referring Physician: Nickola Major, MD [id number] [place] [address] [place] [address]  Chief complaint:   Witnessed apneas, significant snoring Excessive daytime sleepiness  HPI:  Patient has been told about his snoring for many years He did regain about 50 pounds recently-in the last 4 to 5 years Nonrestorative sleep He has chronic back and knee pain Usually goes to bed by midnight, wakes up about 7-8 30 Takes him about 30 minutes to fall asleep He has no memory issues, no chronic headaches Tosses and turns a lot in bed   Occupation: No pertinent history Exposures: No significant exposure Smoking history: Non-smoker  Outpatient Encounter Medications as of 01/29/2018  Medication Sig  . cholecalciferol (VITAMIN D) 1000 units tablet Take 1,000 Units by mouth daily.  Marland Kitchen losartan (COZAAR) 25 MG tablet Take 25 mg by mouth daily.  . Testosterone Cypionate 100 MG/ML SOLN Inject 100 mg as directed every 28 (twenty-eight) days.   No facility-administered encounter medications on file as of 01/29/2018.     Allergies as of 01/29/2018 - Review Complete 01/29/2018  Allergen Reaction Noted  . Sulfur  10/27/2013    Past Medical History:  Diagnosis Date  . Complication of anesthesia    rash afterwords"  . Diverticulitis   . GERD (gastroesophageal reflux disease)   . Hypertension   . Reflux     Past Surgical History:  Procedure Laterality Date  . BACK SURGERY    . BACK SURGERY    . COLON SURGERY    . COLON SURGERY    . KNEE SURGERY    . KNEE SURGERY    . SHOULDER SURGERY      Family History  Problem Relation Age of Onset  . Breast cancer Mother   . Rectal cancer Mother   . Deep vein thrombosis Mother   . Stroke Father   . Heart disease Father 92  . Heart disease Brother 9    Social History   Socioeconomic History  . Marital status: Married     Spouse name: Not on file  . Number of children: Not on file  . Years of education: Not on file  . Highest education level: Not on file  Occupational History  . Not on file  Social Needs  . Financial resource strain: Not on file  . Food insecurity:    Worry: Not on file    Inability: Not on file  . Transportation needs:    Medical: Not on file    Non-medical: Not on file  Tobacco Use  . Smoking status: Never Smoker  . Smokeless tobacco: Never Used  Substance and Sexual Activity  . Alcohol use: Yes    Comment: rarely  . Drug use: No  . Sexual activity: Yes  Lifestyle  . Physical activity:    Days per week: Not on file    Minutes per session: Not on file  . Stress: Not on file  Relationships  . Social connections:    Talks on phone: Not on file    Gets together: Not on file    Attends religious service: Not on file    Active member of club or organization: Not on file    Attends meetings of clubs or organizations: Not on file    Relationship status: Not on file  . Intimate partner violence:    Fear  of current or ex partner: Not on file    Emotionally abused: Not on file    Physically abused: Not on file    Forced sexual activity: Not on file  Other Topics Concern  . Not on file  Social History Narrative  . Not on file    Review of Systems  HENT: Negative.   Eyes: Negative.   Respiratory: Negative.   Endocrine: Negative.   Genitourinary: Negative.   Musculoskeletal: Positive for back pain.  Skin: Negative.     Vitals:   01/29/18 1011  BP: 122/86  Pulse: 87  SpO2: 95%     Physical Exam  Constitutional: He is oriented to person, place, and time. He appears well-developed and well-nourished. No distress.  HENT:  Head: Normocephalic and atraumatic.  Mallampati 3  Eyes: Pupils are equal, round, and reactive to light. Conjunctivae and EOM are normal. Right eye exhibits no discharge. Left eye exhibits no discharge.  Neck: Normal range of motion. Neck supple.  No tracheal deviation present. No thyromegaly present.  Cardiovascular: Normal rate and regular rhythm.  Pulmonary/Chest: Effort normal and breath sounds normal. No respiratory distress. He has no wheezes.  Abdominal: Soft. Bowel sounds are normal. He exhibits no distension. There is no tenderness.  Musculoskeletal: He exhibits no edema.  Neurological: He is alert and oriented to person, place, and time. No cranial nerve deficit.  Skin: Skin is warm and dry. He is not diaphoretic. No erythema.   Data Reviewed: Records reviewed  Assessment:  1.  High probability of significant sleep disordered breathing 2.  Nonrestorative sleep 3.  Significant snoring 4.  Daytime sleepiness  Plan/Recommendations: 1.  We will schedule a home sleep study 2.  Encouraged to continue weight loss efforts 3.  Pathophysiology of sleep disordered breathing discussed 4.  Treatment options of significant sleep disordered breathing discussed  I will see you back in the office about 4 to 6 weeks following initiation of treatment   Sherrilyn Rist MD [facility] 01/29/2018, 10:30 AM  CC: Nickola Major, MD

## 2018-01-29 NOTE — Patient Instructions (Addendum)
High probability of significant sleep disordered breathing  Obesity  Chronic back pain   We will set you up with a home sleep study  I will see you back in the office about 6 weeks following initiation of treatment  Continue weight loss efforts

## 2018-02-09 DIAGNOSIS — G4733 Obstructive sleep apnea (adult) (pediatric): Secondary | ICD-10-CM

## 2018-02-10 ENCOUNTER — Other Ambulatory Visit: Payer: Self-pay | Admitting: *Deleted

## 2018-02-10 DIAGNOSIS — G4733 Obstructive sleep apnea (adult) (pediatric): Secondary | ICD-10-CM

## 2018-02-13 ENCOUNTER — Telehealth: Payer: Self-pay | Admitting: Pulmonary Disease

## 2018-02-13 DIAGNOSIS — G4733 Obstructive sleep apnea (adult) (pediatric): Secondary | ICD-10-CM

## 2018-02-13 NOTE — Telephone Encounter (Addendum)
Dr. Ander Slade has reviewed the home sleep test this showed Severe obstructive sleep apnea.   Recommendations   Treatment options are CPAP with the settings auto 5 to 18.   Weight loss measure.   Advise against driving while sleepy & against medication with sedative side effects.   Left message for patient to call back.  Sleep study on 02/10/18  Make appointment for 8 to 10 weeks for compliance with download with Dr. Ander Slade.

## 2018-02-19 ENCOUNTER — Emergency Department (HOSPITAL_COMMUNITY): Payer: 59

## 2018-02-19 ENCOUNTER — Other Ambulatory Visit: Payer: Self-pay

## 2018-02-19 ENCOUNTER — Encounter (HOSPITAL_COMMUNITY): Payer: Self-pay | Admitting: Emergency Medicine

## 2018-02-19 ENCOUNTER — Emergency Department (HOSPITAL_COMMUNITY)
Admission: EM | Admit: 2018-02-19 | Discharge: 2018-02-19 | Disposition: A | Discharge status: Home / Self Care | Payer: 59 | Attending: Emergency Medicine | Admitting: Emergency Medicine

## 2018-02-19 DIAGNOSIS — R002 Palpitations: Secondary | ICD-10-CM | POA: Insufficient documentation

## 2018-02-19 DIAGNOSIS — I10 Essential (primary) hypertension: Secondary | ICD-10-CM | POA: Diagnosis not present

## 2018-02-19 DIAGNOSIS — R0789 Other chest pain: Secondary | ICD-10-CM | POA: Diagnosis not present

## 2018-02-19 DIAGNOSIS — R079 Chest pain, unspecified: Secondary | ICD-10-CM

## 2018-02-19 DIAGNOSIS — R0602 Shortness of breath: Secondary | ICD-10-CM | POA: Insufficient documentation

## 2018-02-19 LAB — BASIC METABOLIC PANEL WITH GFR
Anion gap: 10 (ref 5–15)
BUN: 26 mg/dL — ABNORMAL HIGH (ref 6–20)
CO2: 26 mmol/L (ref 22–32)
Calcium: 9 mg/dL (ref 8.9–10.3)
Chloride: 104 mmol/L (ref 98–111)
Creatinine, Ser: 0.94 mg/dL (ref 0.61–1.24)
GFR calc Af Amer: >60 mL/min
GFR calc non Af Amer: >60 mL/min
Glucose, Bld: 108 mg/dL — ABNORMAL HIGH (ref 70–99)
Potassium: 4.2 mmol/L (ref 3.5–5.1)
Sodium: 140 mmol/L (ref 135–145)

## 2018-02-19 LAB — CBC
HCT: 47.3 % (ref 39.0–52.0)
Hemoglobin: 15.6 g/dL (ref 13.0–17.0)
MCH: 29.4 pg (ref 26.0–34.0)
MCHC: 33 g/dL (ref 30.0–36.0)
MCV: 89.1 fL (ref 78.0–100.0)
Platelets: 234 K/uL (ref 150–400)
RBC: 5.31 MIL/uL (ref 4.22–5.81)
RDW: 15 % (ref 11.5–15.5)
WBC: 10.4 K/uL (ref 4.0–10.5)

## 2018-02-19 LAB — D-DIMER, QUANTITATIVE (NOT AT ARMC): D DIMER QUANT: 0.35 ug{FEU}/mL (ref 0.00–0.50)

## 2018-02-19 LAB — POCT I-STAT TROPONIN I: Troponin i, poc: 0 ng/mL (ref 0.00–0.08)

## 2018-02-19 LAB — TROPONIN I

## 2018-02-19 NOTE — ED Provider Notes (Signed)
[facility] DEPT Provider Note   CSN: [id number] Arrival date & time: 02/19/18  0411     History   Chief Complaint Chief Complaint  Patient presents with  . Chest Pain    HPI Andrew Hooper is a 52 y.o. male with a history of hypertension, GERD who presents emergency department today for chest pain.  Patient reports that around 1:30 AM this morning he awoke with palpitations, left-sided chest pressure and a tingling sensation of his left arm.  He reports he did have some associated shortness of breath.  He denies any radiation of the pain to his neck, jaw or back.  He reports the pain was constant from 1:30 AM to 4 AM.  He reports that nothing made the symptoms better or worse.  His symptoms were not worsened with walking/exertion.  There were non-positional and nonpleuritic in nature.  He did not take anything for his symptoms.  He denies any history of similar symptoms.  He does note that he has had increased amount of stress at work recently and has been consuming an increased amount of 5-hour energies throughout the day.  He reports that he is currently chest pain-free without palpitations.  He notes that the symptoms have not recurred.  Patient denies any associated fever, chills, abdominal pain, nausea/vomiting, diaphoresis or lower leg swelling.  Patient does report a recent long car trip on Monday where he drove for approximately 5 hours.  Patient is on testosterone therapy. No cough or hemoptysis. He is a never smoker. No history of prior MI.  Family history is positive for CAD. No current symptoms.   HPI  Past Medical History:  Diagnosis Date  . Complication of anesthesia    rash afterwords"  . Diverticulitis   . GERD (gastroesophageal reflux disease)   . Hypertension   . Reflux     Patient Active Problem List   Diagnosis Date Noted  . Family history of ischemic heart disease 10/27/2013  . Obesity, unspecified 10/27/2013  . Chest pain  06/19/2011  . Left shoulder pain 06/19/2011    Past Surgical History:  Procedure Laterality Date  . BACK SURGERY    . BACK SURGERY    . COLON SURGERY    . COLON SURGERY    . KNEE SURGERY    . KNEE SURGERY    . SHOULDER SURGERY          Home Medications    Prior to Admission medications   Medication Sig Start Date End Date Taking? Authorizing Provider  cholecalciferol (VITAMIN D) 1000 units tablet Take 1,000 Units by mouth daily.   Yes [provider]  losartan (COZAAR) 25 MG tablet Take 25 mg by mouth daily.   Yes [provider]  Testosterone Cypionate 100 MG/ML SOLN Inject 100 mg as directed every 28 (twenty-eight) days.   Yes [provider]    Family History Family History  Problem Relation Age of Onset  . Breast cancer Mother   . Rectal cancer Mother   . Deep vein thrombosis Mother   . Stroke Father   . Heart disease Father 42  . Heart disease Brother 37    Social History Social History   Tobacco Use  . Smoking status: Never Smoker  . Smokeless tobacco: Never Used  Substance Use Topics  . Alcohol use: Yes    Comment: rarely  . Drug use: No     Allergies   Sulfamethoxazole   Review of Systems Review of Systems  All other systems reviewed and are negative.    Physical Exam Updated Vital Signs BP 125/84   Pulse 67   Temp 98.2 F (36.8 C) (Oral)   Resp 13   Ht 5\' 11"  (1.803 m)   Wt 118.4 kg   SpO2 95%   BMI 36.40 kg/m   Physical Exam  Constitutional: He appears well-developed and well-nourished.  HENT:  Head: Normocephalic and atraumatic.  Right Ear: External ear normal.  Left Ear: External ear normal.  Nose: Nose normal.  Mouth/Throat: Uvula is midline, oropharynx is clear and moist and mucous membranes are normal. No tonsillar exudate.  Eyes: Pupils are equal, round, and reactive to light. Right eye exhibits no discharge. Left eye exhibits no discharge. No scleral icterus.  Neck: Trachea normal. Neck  supple. No JVD present. No spinous process tenderness present. Carotid bruit is not present. No neck rigidity. Normal range of motion present.  Cardiovascular: Normal rate, regular rhythm and intact distal pulses.  No murmur heard. Pulses:      Radial pulses are 2+ on the right side, and 2+ on the left side.       Dorsalis pedis pulses are 2+ on the right side, and 2+ on the left side.       Posterior tibial pulses are 2+ on the right side, and 2+ on the left side.  No lower extremity swelling or edema. Calves symmetric in size bilaterally.  Pulmonary/Chest: Effort normal and breath sounds normal. He exhibits no tenderness.  Abdominal: Soft. Bowel sounds are normal. There is no tenderness. There is no rebound and no guarding.  Musculoskeletal: He exhibits no edema.  Lymphadenopathy:    He has no cervical adenopathy.  Neurological: He is alert.  Skin: Skin is warm and dry. No rash noted. He is not diaphoretic.  Psychiatric: He has a normal mood and affect.  Nursing note and vitals reviewed.    ED Treatments / Results  Labs (all labs ordered are listed, but only abnormal results are displayed) Labs Reviewed  BASIC METABOLIC PANEL - Abnormal; Notable for the following components:      Result Value   Glucose, Bld 108 (*)    BUN 26 (*)    All other components within normal limits  CBC  TROPONIN I  D-DIMER, QUANTITATIVE (NOT AT [facility])    EKG EKG Interpretation  Date/Time:  Thursday February 19 2018 04:20:05 EDT Ventricular Rate:  85 PR Interval:    QRS Duration: 86 QT Interval:  339 QTC Calculation: 403 R Axis:   47 Text Interpretation:  Sinus rhythm Abnormal R-wave progression, early transition Confirmed by Veryl Speak [phone]) on 02/19/2018 5:56:00 AM Also confirmed by Veryl Speak [phone]), editor Hattie Perch ([id number])  on 02/19/2018 7:25:08 AM   Radiology Dg Chest 2 View  Result Date: 02/19/2018 CLINICAL DATA:  Initial evaluation for acute chest pain. EXAM: CHEST  - 2 VIEW COMPARISON:  Prior radiograph from 06/19/2011. FINDINGS: The cardiac and mediastinal silhouettes are stable in size and contour, and remain within normal limits. The lungs are normally inflated. No airspace consolidation, pleural effusion, or pulmonary edema is identified. There is no pneumothorax. No acute osseous abnormality identified. IMPRESSION: No active cardiopulmonary disease. Electronically Signed   By: Jeannine Boga M.D.   On: 02/19/2018 05:05    Procedures Procedures (including critical care time)  Medications Ordered in ED Medications - No data to display   Initial Impression / Assessment and Plan / ED Course  I have reviewed the triage vital  signs and the nursing notes.  Pertinent labs & imaging results that were available during my care of the patient were reviewed by me and considered in my medical decision making (see chart for details).     52 y.o. male presents with chest palpitations, chest pain, left arm tingling and shortness of breath began in the early a.m. this morning.  Pain is since subsided.  Is not recurred and patient is currently asymptomatic free.  He does note increased stress at home/work recently and increased caffeine intake.  Patient's work-up has been reassuring.  Patient has stable vital signs in the department.  There is no tracheal deviation, no JVD, no new murmur, heart is regular rate and rhythm, and breath sounds are equal bilaterally.  EKG is without any acute abnormalities.  No ST changes.  No evidence of STEMI.  Patient has had negative troponin x2.  Chest x-ray without any abnormalities noted.  This was reviewed by myself.  D-dimer was within normal limits.  Do not suspect PE given this. HEART score reviewed. Patient has been advised to return to the ED if chest pain becomes exertional, associated with diaphoresis or nausea, radiates to left jaw, worsens, returns, or becomes concerning in any way. Patient appears reliable for follow up  and is agreeable to discharge. Patient does have close PCP follow up (Dr. Modena Morrow). I advised the patient to follow-up with their primary care provider this week. I advised the patient to return to the emergency department with new or worsening symptoms or new concerns. The patient verbalized understanding and agreement with plan. Patient stable for discharge. He has remained chest pain free while in the department.   This patient was discussed with Dr. Gilford Raid who is in agreement with assessment and plan.   Final Clinical Impressions(s) / ED Diagnoses   Final diagnoses:  Nonspecific chest pain    ED Discharge Orders    None       Lorelle Gibbs 02/19/18 1442    Isla Pence, MD 02/19/18 [phone]

## 2018-02-19 NOTE — Discharge Instructions (Signed)
Read instructions below for reasons to return to the Emergency Department. It is recommended that your follow up with your Primary Care Doctor in regards to today's visit. If you do not have a doctor, use the resource guide listed below to help you find one.    Tests performed today include: An EKG of your heart A chest x-ray D-Dimer Cardiac enzymes - a blood test for heart muscle damage Blood counts and electrolytes Vital signs. See below for your results today.   Chest Pain (Nonspecific)  HOME CARE INSTRUCTIONS  For the next few days, avoid physical activities that bring on chest pain. Continue physical activities as directed. Please avoid drinks with large amounts of caffeine.  Do not smoke cigarettes or drink alcohol until your symptoms are gone. If you do smoke, it is time to quit. You may receive instructions and counseling on how to stop smoking. Only take over-the-counter or prescription medicine for pain, discomfort, or fever as directed by your caregiver.  Follow your caregiver's suggestions for further testing if your chest pain does not go away.  Keep any follow-up appointments you made. If you do not go to an appointment, you could develop lasting (chronic) problems with pain. If there is any problem keeping an appointment, you must call to reschedule.  SEEK MEDICAL CARE IF:  You think you are having problems from the medicine you are taking. Read your medicine instructions carefully.  Your chest pain does not go away, even after treatment.  You develop a rash with blisters on your chest.  SEEK IMMEDIATE MEDICAL CARE IF:  You have increased chest pain or pain that spreads to your arm, neck, jaw, back, or belly (abdomen).  You develop shortness of breath, an increasing cough, or you are coughing up blood.  You have severe back or abdominal pain, feel sick to your stomach (nauseous) or throw up (vomit).  You develop severe weakness, fainting, or chills.  You have an oral  temperature above 102 F (38.9 C), not controlled by medicine.  THIS IS AN EMERGENCY. Do not wait to see if the pain will go away. Get medical help at once. Call your local emergency services (911 in [place] Do not drive yourself to the hospital. Additional Information:  Your vital signs today were: BP 125/79    Pulse 73    Temp 98.2 F (36.8 C) (Oral)    Resp 11    Ht 5\' 11"  (1.803 m)    Wt 118.4 kg    SpO2 98%    BMI 36.40 kg/m  If your blood pressure (BP) was elevated above 135/85 this visit, please have this repeated by your doctor within one month. ---------------

## 2018-02-19 NOTE — ED Triage Notes (Signed)
Pt present with complaints of mid center chest pressure and stating his left arm feels "different." Patient states he woke up feeling tachycardic with chest pain/ pressure and the feeling in his left arm. Patient with family heart hx.

## 2018-03-02 NOTE — Telephone Encounter (Signed)
Spoke with pt. He is aware of his results. Order has been placed for CPAP. OV has been scheduled for 05/06/18 at 9:15am. Nothing further was needed.

## 2018-03-02 NOTE — Telephone Encounter (Signed)
Pt is calling back about the sleep study results [phone]

## 2018-03-02 NOTE — Telephone Encounter (Signed)
Attempted to call pt. I did not receive an answer. I have left a message for pt to return our call.  

## 2018-03-02 NOTE — Telephone Encounter (Signed)
Pt is calling back [phone]

## 2018-05-06 ENCOUNTER — Ambulatory Visit: Payer: 59 | Admitting: Pulmonary Disease

## 2019-08-08 ENCOUNTER — Encounter (HOSPITAL_COMMUNITY): Payer: Self-pay

## 2019-08-08 ENCOUNTER — Emergency Department (HOSPITAL_COMMUNITY)
Admission: EM | Admit: 2019-08-08 | Discharge: 2019-08-09 | Disposition: A | Discharge status: Home / Self Care | Payer: 59 | Attending: Emergency Medicine | Admitting: Emergency Medicine

## 2019-08-08 ENCOUNTER — Other Ambulatory Visit: Payer: Self-pay

## 2019-08-08 DIAGNOSIS — I10 Essential (primary) hypertension: Secondary | ICD-10-CM | POA: Insufficient documentation

## 2019-08-08 DIAGNOSIS — L270 Generalized skin eruption due to drugs and medicaments taken internally: Secondary | ICD-10-CM | POA: Insufficient documentation

## 2019-08-08 DIAGNOSIS — Z79899 Other long term (current) drug therapy: Secondary | ICD-10-CM | POA: Diagnosis not present

## 2019-08-08 DIAGNOSIS — R21 Rash and other nonspecific skin eruption: Secondary | ICD-10-CM | POA: Diagnosis present

## 2019-08-08 MED ORDER — DEXAMETHASONE SODIUM PHOSPHATE 10 MG/ML IJ SOLN
10.0000 mg | Freq: Once | INTRAMUSCULAR | Status: AC
  Administered 2019-08-09: 10 mg via INTRAMUSCULAR
  Filled 2019-08-08: qty 1

## 2019-08-08 NOTE — ED Provider Notes (Signed)
[facility] DEPT Provider Note   CSN: [id number] Arrival date & time: 08/08/19  2215     History Chief Complaint  Patient presents with  . Hypertension  . Medication Reaction    Kaelan Mandala is a 54 y.o. male.  Patient presents to the emergency department for evaluation of rash.  Patient reports that he has been experiencing flushing and redness of his skin for the last couple of days.  Symptoms began after he had hydrochlorothiazide added for persistent hypertension.  He also takes losartan.  Patient does take testosterone every 2 weeks, last dose was 12 days ago.  No new foods.  He denies tongue swelling, throat swelling, difficulty swallowing and breathing.  No abdominal pain, nausea or vomiting.  Patient did not take his hydrochlorothiazide this morning because he was concerned that this was the cause of the rash.        Past Medical History:  Diagnosis Date  . Complication of anesthesia    rash afterwords"  . Diverticulitis   . GERD (gastroesophageal reflux disease)   . Hypertension   . Reflux     Patient Active Problem List   Diagnosis Date Noted  . Family history of ischemic heart disease 10/27/2013  . Obesity, unspecified 10/27/2013  . Chest pain 06/19/2011  . Left shoulder pain 06/19/2011    Past Surgical History:  Procedure Laterality Date  . BACK SURGERY    . BACK SURGERY    . COLON SURGERY    . COLON SURGERY    . KNEE SURGERY    . KNEE SURGERY    . SHOULDER SURGERY         Family History  Problem Relation Age of Onset  . Breast cancer Mother   . Rectal cancer Mother   . Deep vein thrombosis Mother   . Stroke Father   . Heart disease Father 2  . Heart disease Brother 63    Social History   Tobacco Use  . Smoking status: Never Smoker  . Smokeless tobacco: Never Used  Substance Use Topics  . Alcohol use: Yes    Comment: rarely  . Drug use: No    Home Medications Prior to Admission medications     Medication Sig Start Date End Date Taking? Authorizing Provider  cholecalciferol (VITAMIN D) 1000 units tablet Take 1,000 Units by mouth daily.    [provider]  losartan (COZAAR) 25 MG tablet Take 25 mg by mouth daily.    [provider]  Testosterone Cypionate 100 MG/ML SOLN Inject 100 mg as directed every 28 (twenty-eight) days.    [provider]    Allergies    Sulfamethoxazole  Review of Systems   Review of Systems  Skin: Positive for rash.  All other systems reviewed and are negative.   Physical Exam Updated Vital Signs BP (!) 158/88 (BP Location: Left Arm)   Pulse 78   Temp 98.6 F (37 C) (Oral)   Resp 16   SpO2 99%   Physical Exam Vitals and nursing note reviewed.  Constitutional:      General: He is not in acute distress.    Appearance: Normal appearance. He is well-developed.  HENT:     Head: Normocephalic and atraumatic.     Right Ear: Hearing normal.     Left Ear: Hearing normal.     Nose: Nose normal.  Eyes:     Conjunctiva/sclera: Conjunctivae normal.     Pupils: Pupils are equal, round, and reactive  to light.  Cardiovascular:     Rate and Rhythm: Regular rhythm.     Heart sounds: S1 normal and S2 normal. No murmur. No friction rub. No gallop.   Pulmonary:     Effort: Pulmonary effort is normal. No respiratory distress.     Breath sounds: Normal breath sounds.  Chest:     Chest wall: No tenderness.  Abdominal:     General: Bowel sounds are normal.     Palpations: Abdomen is soft.     Tenderness: There is no abdominal tenderness. There is no guarding or rebound. Negative signs include Murphy's sign and McBurney's sign.     Hernia: No hernia is present.  Musculoskeletal:        General: Normal range of motion.     Cervical back: Normal range of motion and neck supple.  Skin:    General: Skin is warm and dry.     Findings: Rash (Diffuse blanching erythema and warmth of skin) present.  Neurological:     Mental Status:  He is alert and oriented to person, place, and time.     GCS: GCS eye subscore is 4. GCS verbal subscore is 5. GCS motor subscore is 6.     Cranial Nerves: No cranial nerve deficit.     Sensory: No sensory deficit.     Coordination: Coordination normal.  Psychiatric:        Speech: Speech normal.        Behavior: Behavior normal.        Thought Content: Thought content normal.     ED Results / Procedures / Treatments   Labs (all labs ordered are listed, but only abnormal results are displayed) Labs Reviewed - No data to display  EKG None  Radiology No results found.  Procedures Procedures (including critical care time)  Medications Ordered in ED Medications  dexamethasone (DECADRON) injection 10 mg (has no administration in time range)    ED Course  I have reviewed the triage vital signs and the nursing notes.  Pertinent labs & imaging results that were available during my care of the patient were reviewed by me and considered in my medical decision making (see chart for details).    MDM Rules/Calculators/A&P                      Patient presents with nonspecific erythema distributed diffusely over his body.  He does not have any associated itching or pain.  Symptoms began after starting hydrochlorothiazide.  He did not take his dose today but symptoms persist.  No signs of anaphylaxis.  Patient is comfortable and without any other complaints.  Blood pressure is mildly elevated currently, has baseline hypertension.  Losartan dose was recently increased when the hydrochlorothiazide was added.  No symptoms of angioedema.  Has not had any significant sun exposure.  Patient administered Decadron and will continue antihistamine treatment at home, hold hydrochlorothiazide and follow-up with PCP.  Final Clinical Impression(s) / ED Diagnoses Final diagnoses:  Drug-induced skin rash    Rx / DC Orders ED Discharge Orders    None       Aydee Mcnew, Gwenyth Allegra, MD 08/09/19  0004

## 2019-08-08 NOTE — ED Triage Notes (Signed)
Pt reports PCP  increased Losartan to 50 mg and added HCTZ to his medications within the last week. Pt reports the last few days he has noticed redness on his arms, chest, and head. Pt is concerned about a medication reaction and states he stopped taking HCTZ today. Pt reports taking Benadryl about an hour 1/2 ago.

## 2019-08-10 ENCOUNTER — Other Ambulatory Visit (HOSPITAL_COMMUNITY): Payer: Self-pay | Admitting: Internal Medicine

## 2019-08-10 ENCOUNTER — Other Ambulatory Visit: Payer: Self-pay | Admitting: Internal Medicine

## 2019-08-10 DIAGNOSIS — Z8249 Family history of ischemic heart disease and other diseases of the circulatory system: Secondary | ICD-10-CM

## 2019-08-24 ENCOUNTER — Other Ambulatory Visit: Payer: Self-pay

## 2019-08-24 ENCOUNTER — Ambulatory Visit (HOSPITAL_COMMUNITY)
Admission: RE | Admit: 2019-08-24 | Discharge: 2019-08-24 | Disposition: A | Discharge status: Home / Self Care | Payer: 59 | Source: Ambulatory Visit | Attending: Internal Medicine | Admitting: Internal Medicine

## 2019-08-24 ENCOUNTER — Ambulatory Visit (HOSPITAL_COMMUNITY): Payer: 59

## 2019-08-24 DIAGNOSIS — Z8249 Family history of ischemic heart disease and other diseases of the circulatory system: Secondary | ICD-10-CM | POA: Insufficient documentation

## 2019-08-24 DIAGNOSIS — I712 Thoracic aortic aneurysm, without rupture: Secondary | ICD-10-CM | POA: Diagnosis not present

## 2019-09-07 ENCOUNTER — Encounter: Payer: Self-pay | Admitting: Internal Medicine

## 2019-09-07 ENCOUNTER — Ambulatory Visit (INDEPENDENT_AMBULATORY_CARE_PROVIDER_SITE_OTHER): Payer: 59 | Admitting: Internal Medicine

## 2019-09-07 ENCOUNTER — Ambulatory Visit (HOSPITAL_COMMUNITY): Payer: 59

## 2019-09-07 ENCOUNTER — Other Ambulatory Visit: Payer: Self-pay

## 2019-09-07 VITALS — BP 132/78 | HR 89 | Temp 97.7°F | Ht 71.0 in | Wt 251.0 lb

## 2019-09-07 DIAGNOSIS — R931 Abnormal findings on diagnostic imaging of heart and coronary circulation: Secondary | ICD-10-CM | POA: Diagnosis not present

## 2019-09-07 DIAGNOSIS — R079 Chest pain, unspecified: Secondary | ICD-10-CM

## 2019-09-07 DIAGNOSIS — I712 Thoracic aortic aneurysm, without rupture: Secondary | ICD-10-CM

## 2019-09-07 DIAGNOSIS — I7121 Aneurysm of the ascending aorta, without rupture: Secondary | ICD-10-CM

## 2019-09-07 DIAGNOSIS — E785 Hyperlipidemia, unspecified: Secondary | ICD-10-CM

## 2019-09-07 NOTE — Patient Instructions (Signed)
Medication Instructions:  Take amlodipine 2.5mg  in addition to other current medications  *If you need a refill on your cardiac medications before your next appointment, please call your pharmacy*   Lab Work: Center in 3 months - to check cholesterol  If you have labs (blood work) drawn today and your tests are completely normal, you will receive your results only by: Marland Kitchen MyChart Message (if you have MyChart) OR . A paper copy in the mail If you have any lab test that is abnormal or we need to change your treatment, we will call you to review the results.   Testing/Procedures: NONE   Follow-Up: At [facility], you and your health needs are our priority.  As part of our continuing mission to provide you with exceptional heart care, we have created designated Provider Care Teams.  These Care Teams include your primary Cardiologist (physician) and Advanced Practice Providers (APPs -  Physician Assistants and Nurse Practitioners) who all work together to provide you with the care you need, when you need it.  We recommend signing up for the patient portal called "MyChart".  Sign up information is provided on this After Visit Summary.  MyChart is used to connect with patients for Virtual Visits (Telemedicine).  Patients are able to view lab/test results, encounter notes, upcoming appointments, etc.  Non-urgent messages can be sent to your provider as well.   To learn more about what you can do with MyChart, go to [email or web address].    Your next appointment:   3 month(s)  The format for your next appointment:   In Person  Provider:   K. Mali Hilty, MD   Other Instructions

## 2019-09-08 ENCOUNTER — Encounter: Payer: Self-pay | Admitting: Internal Medicine

## 2019-09-08 NOTE — Progress Notes (Signed)
OFFICE CONSULT NOTE  Chief Complaint:  Evaluate coronary calcification  Primary Care Physician: Deland Pretty, MD  HPI:  Andrew Hooper is a 54 y.o. male who is being seen today for the evaluation of coronary calcification at the request of Deland Pretty, MD. This is a pleasant 54 yo male kindly referred for evaluation of coronary artery calcification.  He underwent CT calcium scoring on August 24, 2019.  This demonstrated calcium score of 52 which was 68th percentile for age and sex matched control.  Additionally he was noted to have a dilated ascending aorta measuring between 4.4 and 4.5 cm.  He does have a history of hypertension as well as a family history of premature onset coronary disease in his father and brother in their 57s and 6s respectively.  He was surprised that this finding did not show up on a recent CT scan he had for work-up of some adenopathy.  Apparently was found to have a monoclonal B-cell lymphoma which was not necessarily considered malignant and it was recommended that it be monitored.  Most recently his lipid profile showed total cholesterol 137, triglycerides 119, HDL 31 and LDL of 82.  He is not currently on any lipid-lowering therapy.  He also has been having issues with a rash and side effects possibly from hydrochlorothiazide.  He reports a history of sulfa allergy.  He was taken off of losartan HCTZ and placed on monotherapy losartan.  His blood pressure was not felt to be well controlled and apparently he was complaining of some discoloration of his extremities concerning for possible Raynaud's.  Subsequently his PCP changed him from losartan to amlodipine however Andrew Hooper has not started that medication.  Other medications include testosterone, although he says he is not even in the normal range on it as well as trazodone which he takes at night for sleep.  EKG showed normal sinus rhythm at 89 without ischemic changes.  PMHx:  Past Medical History:    Diagnosis Date  . Complication of anesthesia    rash afterwords"  . Diverticulitis   . GERD (gastroesophageal reflux disease)   . Hypertension   . Reflux     Past Surgical History:  Procedure Laterality Date  . BACK SURGERY    . BACK SURGERY    . COLON SURGERY    . COLON SURGERY    . KNEE SURGERY    . KNEE SURGERY    . SHOULDER SURGERY      FAMHx:  Family History  Problem Relation Age of Onset  . Breast cancer Mother   . Rectal cancer Mother   . Deep vein thrombosis Mother   . Stroke Father   . Heart disease Father 67  . Heart disease Brother 57    SOCHx:   reports that he has never smoked. He has never used smokeless tobacco. He reports current alcohol use. He reports that he does not use drugs.  ALLERGIES:  Allergies  Allergen Reactions  . Sulfamethoxazole Rash    Sulfa based antibiotics     ROS: Pertinent items noted in HPI and remainder of comprehensive ROS otherwise negative.  HOME MEDS: Current Outpatient Medications on File Prior to Visit  Medication Sig Dispense Refill  . acetaminophen (TYLENOL) 500 MG tablet Take by mouth.    Marland Kitchen amLODipine (NORVASC) 2.5 MG tablet Take 2.5 mg by mouth daily.    . busPIRone (BUSPAR) 15 MG tablet Take 15 mg by mouth 2 (two) times daily.    . Cholecalciferol  50 MCG (2000 UT) CAPS Take by mouth.    . fluticasone (FLONASE) 50 MCG/ACT nasal spray Place into the nose.    . losartan (COZAAR) 25 MG tablet Take 25 mg by mouth daily.    Marland Kitchen testosterone cypionate (DEPOTESTOTERONE CYPIONATE) 100 MG/ML injection SMARTSIG:1.5 Milliliter(s) IM Every 2 Weeks    . traZODone (DESYREL) 50 MG tablet Take 50 mg by mouth at bedtime.     No current facility-administered medications on file prior to visit.    LABS/IMAGING: No results found for this or any previous visit (from the past 48 hour(s)). No results found.  LIPID PANEL:    Component Value Date/Time   CHOL 143 06/20/2011 0632   TRIG 106 06/20/2011 0632   HDL 34 (L)  06/20/2011 0632   CHOLHDL 4.2 06/20/2011 0632   VLDL 21 06/20/2011 0632   LDLCALC 88 06/20/2011 0632    WEIGHTS: Wt Readings from Last 3 Encounters:  09/07/19 251 lb (113.9 kg)  02/19/18 261 lb (118.4 kg)  01/29/18 269 lb (122 kg)    VITALS: BP 132/78   Pulse 89   Temp 97.7 F (36.5 C)   Ht 5\' 11"  (1.803 m)   Wt 251 lb (113.9 kg)   SpO2 97%   BMI 35.01 kg/m   EXAM: General appearance: alert and no distress Neck: no carotid bruit, no JVD and thyroid not enlarged, symmetric, no tenderness/mass/nodules Lungs: clear to auscultation bilaterally Heart: regular rate and rhythm Abdomen: soft, non-tender; bowel sounds normal; no masses,  no organomegaly Extremities: extremities normal, atraumatic, no cyanosis or edema Pulses: 2+ and symmetric Skin: Skin color, texture, turgor normal. No rashes or lesions Neurologic: Grossly normal Psych: Pleasant  EKG: Normal sinus rhythm at 89- personally reviewed  ASSESSMENT: 1. Atypical chest pain 2. CAC score 51, 68th percentile for age and sex matched controls 3. Ascending aortic aneurysm (4.4-4.5 cm) 4. Essential hypertension 5. Mixed dyslipidemia 6. Family history of premature onset coronary disease  PLAN: 1.   Andrew Hooper has a low coronary artery calcium score but abnormal for his age.  In addition he was found to have an ascending aortic aneurysm.  I personally reviewed the report from [facility] dated 07/09/2019 which was CT scan of the chest abdomen and pelvis with contrast.  The report indicates the aorta was "normal in caliber and appearance."  There may also be a family history of aneurysm and I believe there was a history of sudden death in the more distant relative.  Both his father and brother had early onset heart disease.  He has hypertension and has not yet switched from losartan to amlodipine.  He was supposed to start amlodipine 2.5 mg daily and is only currently on 25 mg of losartan.  Given the  low-dose of the medication, I think would be advisable to continue the ARB as there is some evidence that might be helpful in aortopathy.  I would recommend adding the amlodipine in addition to his medication and targeting a systolic blood pressure below 120.  He has a relative dyslipidemia and given his coronary calcification and increased risk would recommend an LDL below 70, therefore consider atorvastatin 20 mg daily, however at this time he would like to work on diet, exercise and weight loss.  Would recommend a repeat CT angiogram of the aorta in 1 year per guidelines. Repeat lipids in 3 months with LP(a) given strong family history of coronary disease and early onset findings.  Thanks for the kind  referral.  Pixie Casino, MD, FACC, [place] Director of the [facility] Diplomate of the American Board of Clinical Lipidology Attending Cardiologist  Direct Dial: [phone]  Fax: [phone]  Website:  [email or web address] 09/08/2019, 8:23 PM

## 2019-09-08 NOTE — Addendum Note (Signed)
Addended by: Pixie Casino on: 09/08/2019 08:47 PM   Modules accepted: Level of Service

## 2019-09-13 ENCOUNTER — Telehealth: Payer: Self-pay | Admitting: Internal Medicine

## 2019-09-13 NOTE — Telephone Encounter (Signed)
Patient would like to change care from Dr. Debara Pickett to Dr. Irish Lack. The patient had seen Dr. Irish Lack in the past but had not been seen for 3 years and became inactive. Dr. Debara Pickett had the first available slot. Please let the patient know what the office decides

## 2019-09-15 NOTE — Telephone Encounter (Signed)
OK with me.

## 2019-09-17 NOTE — Telephone Encounter (Signed)
Fine with me.  -Mali

## 2019-09-17 NOTE — Telephone Encounter (Signed)
Recall changed for patient to see Dr. Irish Lack in 3 months instead of Dr. Debara Pickett.

## 2019-10-28 ENCOUNTER — Other Ambulatory Visit: Payer: Self-pay | Admitting: Endocrinology

## 2019-10-28 ENCOUNTER — Other Ambulatory Visit: Payer: Self-pay | Admitting: Nurse Practitioner

## 2019-10-28 ENCOUNTER — Other Ambulatory Visit (HOSPITAL_COMMUNITY): Payer: Self-pay | Admitting: Internal Medicine

## 2019-10-28 ENCOUNTER — Other Ambulatory Visit (HOSPITAL_COMMUNITY): Payer: Self-pay | Admitting: Nurse Practitioner

## 2019-10-28 ENCOUNTER — Other Ambulatory Visit: Payer: Self-pay | Admitting: Internal Medicine

## 2019-10-28 DIAGNOSIS — R7989 Other specified abnormal findings of blood chemistry: Secondary | ICD-10-CM

## 2019-10-28 DIAGNOSIS — E230 Hypopituitarism: Secondary | ICD-10-CM

## 2019-10-30 ENCOUNTER — Other Ambulatory Visit: Payer: Self-pay

## 2019-10-30 ENCOUNTER — Ambulatory Visit (HOSPITAL_BASED_OUTPATIENT_CLINIC_OR_DEPARTMENT_OTHER)
Admission: RE | Admit: 2019-10-30 | Discharge: 2019-10-30 | Disposition: A | Discharge status: Home / Self Care | Payer: 59 | Source: Ambulatory Visit | Attending: Internal Medicine | Admitting: Internal Medicine

## 2019-10-30 DIAGNOSIS — R7989 Other specified abnormal findings of blood chemistry: Secondary | ICD-10-CM | POA: Diagnosis present

## 2019-10-30 DIAGNOSIS — E230 Hypopituitarism: Secondary | ICD-10-CM | POA: Insufficient documentation

## 2019-10-30 MED ORDER — GADOBUTROL 1 MMOL/ML IV SOLN
7.5000 mL | Freq: Once | INTRAVENOUS | Status: AC | PRN
  Administered 2019-10-30: 7.5 mL via INTRAVENOUS

## 2019-11-07 ENCOUNTER — Ambulatory Visit (HOSPITAL_COMMUNITY): Payer: 59

## 2019-11-07 ENCOUNTER — Encounter (HOSPITAL_COMMUNITY): Payer: Self-pay

## 2019-11-24 NOTE — Progress Notes (Signed)
@Patient  ID: Andrew Hooper, male    DOB: Nov 07, 1965, 54 y.o.   MRN: [id number]  Chief Complaint  Patient presents with  . Follow-up    cpap follow up, loss weight 45lbs, having mask leaks, DME [employer]    Referring provider: Deland Pretty, MD  HPI:  54 year old male never smoker followed in our office for obstructive sleep apnea  PMH: Left shoulder pain, chest pain, obesity Smoker/ Smoking History: Never smoker Maintenance: None Pt of: Dr. Jenetta Downer  11/25/2019  - Visit   54 year old male never smoker followed in our office for obstructive sleep apnea.  Patient was last seen in our office in August/2019 at that office visit a home sleep study was ordered.  Patient completed a home sleep study in September/2019.  Those results are listed below:  02/09/2018-home sleep study -AHI 67.8, SaO2 low 77%  Patient completing a follow-up with our office today after starting CPAP therapy.  He has recently lost around 45 lbs.  He is having some mask leaks.  Patient reports has been working with primary care to help with maintaining his sleep.  He is started Ambien controlled release.  He also reports that he is been under significant amount of stress with his job with the [employer].  Questionaires / Pulmonary Flowsheets:   ACT:  No flowsheet data found.  MMRC: No flowsheet data found.  Epworth:  Results of the Epworth flowsheet 01/29/2018  Sitting and reading 2  Watching TV 3  Sitting, inactive in a public place (e.g. a theatre or a meeting) 1  As a passenger in a car for an hour without a break 3  Lying down to rest in the afternoon when circumstances permit 3  Sitting and talking to someone 0  Sitting quietly after a lunch without alcohol 0  In a car, while stopped for a few minutes in traffic 0  Total score 12    Tests:   02/09/2018-home sleep study -AHI 67.8, SaO2 low 77%  FENO:  No results found for: NITRICOXIDE  PFT: No flowsheet data found.  WALK:  No flowsheet data  found.  Imaging: MR BRAIN W WO CONTRAST  Result Date: 11/01/2019 CLINICAL DATA:  Elevation of insulin like growth hormone EXAM: MRI HEAD WITHOUT AND WITH CONTRAST TECHNIQUE: Multiplanar, multiecho pulse sequences of the brain and surrounding structures were obtained without and with intravenous contrast. CONTRAST:  7.31mL GADAVIST GADOBUTROL 1 MMOL/ML IV SOLN COMPARISON:  None. FINDINGS: Brain: Slight heterogeneity of pituitary enhancement without definite discrete lesion. Pituitary infundibulum is midline. There is no acute infarction or intracranial hemorrhage. There is no intracranial mass, mass effect, or edema. There is no hydrocephalus or extra-axial fluid collection. No abnormal brain parenchymal enhancement. Vascular: Major vessel flow voids at the skull base are preserved. Skull and upper cervical spine: Normal marrow signal is preserved. Sinuses/Orbits: Paranasal sinuses are aerated. Orbits are unremarkable. Other: Sella is unremarkable.  Mastoid air cells are clear. IMPRESSION: No sellar or suprasellar mass. Electronically Signed   By: Macy Mis M.D.   On: 11/01/2019 08:08    Lab Results:  CBC    Component Value Date/Time   WBC 10.4 02/19/2018 0539   RBC 5.31 02/19/2018 0539   HGB 15.6 02/19/2018 0539   HCT 47.3 02/19/2018 0539   PLT 234 02/19/2018 0539   MCV 89.1 02/19/2018 0539   MCH 29.4 02/19/2018 0539   MCHC 33.0 02/19/2018 0539   RDW 15.0 02/19/2018 0539   LYMPHSABS 1.7 06/19/2011 1913   MONOABS 0.5  06/19/2011 1913   EOSABS 0.1 06/19/2011 1913   BASOSABS 0.0 06/19/2011 1913    BMET    Component Value Date/Time   NA 140 02/19/2018 0539   K 4.2 02/19/2018 0539   CL 104 02/19/2018 0539   CO2 26 02/19/2018 0539   GLUCOSE 108 (H) 02/19/2018 0539   BUN 26 (H) 02/19/2018 0539   CREATININE 0.94 02/19/2018 0539   CALCIUM 9.0 02/19/2018 0539   GFRNONAA >60 02/19/2018 0539   GFRAA >60 02/19/2018 0539    BNP No results found for: BNP  ProBNP No results found  for: PROBNP  Specialty Problems      Pulmonary Problems   OSA (obstructive sleep apnea)      Allergies  Allergen Reactions  . Sulfamethoxazole Rash    Sulfa based antibiotics     Immunization History  Administered Date(s) Administered  . Influenza,inj,Quad PF,6+ Mos 03/07/2017  . Influenza-Unspecified 04/06/2015, 03/07/2017, 03/02/2018, 02/16/2019  . Tdap 10/10/2015    Past Medical History:  Diagnosis Date  . Complication of anesthesia    rash afterwords"  . Diverticulitis   . GERD (gastroesophageal reflux disease)   . Hypertension   . Reflux     Tobacco History: Social History   Tobacco Use  Smoking Status Never Smoker  Smokeless Tobacco Never Used   Counseling given: Yes   Continue to not smoke  Outpatient Encounter Medications as of 11/25/2019  Medication Sig  . acetaminophen (TYLENOL) 500 MG tablet Take by mouth.  . Cholecalciferol 50 MCG (2000 UT) CAPS Take by mouth.  . escitalopram (LEXAPRO) 10 MG tablet Take 10 mg by mouth daily.  . fluticasone (FLONASE) 50 MCG/ACT nasal spray Place into the nose.  . irbesartan (AVAPRO) 300 MG tablet Take 300 mg by mouth daily.  Marland Kitchen zolpidem (AMBIEN CR) 12.5 MG CR tablet Take 12.5 mg by mouth at bedtime as needed.  . [DISCONTINUED] amLODipine (NORVASC) 2.5 MG tablet Take 2.5 mg by mouth daily.  . [DISCONTINUED] busPIRone (BUSPAR) 15 MG tablet Take 15 mg by mouth 2 (two) times daily.  . [DISCONTINUED] losartan (COZAAR) 25 MG tablet Take 25 mg by mouth daily.  . [DISCONTINUED] testosterone cypionate (DEPOTESTOTERONE CYPIONATE) 100 MG/ML injection SMARTSIG:1.5 Milliliter(s) IM Every 2 Weeks  . [DISCONTINUED] traZODone (DESYREL) 50 MG tablet Take 50 mg by mouth at bedtime.   No facility-administered encounter medications on file as of 11/25/2019.     Review of Systems  Review of Systems  Constitutional: Negative for activity change, chills, fatigue, fever and unexpected weight change.  HENT: Negative for postnasal  drip, rhinorrhea, sinus pressure, sinus pain and sore throat.   Eyes: Negative.   Respiratory: Negative for cough, shortness of breath and wheezing.   Cardiovascular: Negative for chest pain and palpitations.  Gastrointestinal: Negative for constipation, diarrhea, nausea and vomiting.  Endocrine: Negative.   Genitourinary: Negative.   Musculoskeletal: Negative.   Skin: Negative.   Neurological: Negative for dizziness and headaches.  Psychiatric/Behavioral: Negative for dysphoric mood. The patient is nervous/anxious.   All other systems reviewed and are negative.    Physical Exam  BP 128/62 (BP Location: Right Arm, Cuff Size: Normal)   Pulse (!) 112   Temp 98.3 F (36.8 C) (Oral)   Ht 5\' 11"  (1.803 m)   Wt 231 lb (104.8 kg)   SpO2 97% Comment: RA  BMI 32.22 kg/m   Wt Readings from Last 5 Encounters:  11/25/19 231 lb (104.8 kg)  09/07/19 251 lb (113.9 kg)  02/19/18 261 lb (118.4  kg)  01/29/18 269 lb (122 kg)  10/27/13 256 lb (116.1 kg)    BMI Readings from Last 5 Encounters:  11/25/19 32.22 kg/m  09/07/19 35.01 kg/m  02/19/18 36.40 kg/m  01/29/18 37.52 kg/m  10/27/13 34.72 kg/m     Physical Exam Vitals and nursing note reviewed.  Constitutional:      General: He is not in acute distress.    Appearance: Normal appearance.  HENT:     Head: Normocephalic and atraumatic.     Right Ear: Hearing and external ear normal.     Left Ear: Hearing and external ear normal.     Nose: No mucosal edema.     Right Turbinates: Not enlarged.     Left Turbinates: Not enlarged.  Eyes:     Pupils: Pupils are equal, round, and reactive to light.  Cardiovascular:     Rate and Rhythm: Regular rhythm. Tachycardia present.     Pulses: Normal pulses.     Heart sounds: Normal heart sounds. No murmur heard.   Pulmonary:     Effort: Pulmonary effort is normal.     Breath sounds: Normal breath sounds. No decreased breath sounds, wheezing or rales.  Musculoskeletal:     Cervical  back: Normal range of motion.     Right lower leg: No edema.     Left lower leg: No edema.  Lymphadenopathy:     Cervical: No cervical adenopathy.  Skin:    General: Skin is warm and dry.     Capillary Refill: Capillary refill takes less than 2 seconds.     Findings: No erythema or rash.  Neurological:     General: No focal deficit present.     Mental Status: He is alert and oriented to person, place, and time.     Motor: No weakness.     Coordination: Coordination normal.     Gait: Gait is intact. Gait normal.  Psychiatric:        Mood and Affect: Mood normal.        Behavior: Behavior normal. Behavior is cooperative.        Thought Content: Thought content normal.        Judgment: Judgment normal.       Assessment & Plan:   Discussion: Patient's obstructive sleep apnea is well controlled.  Reviewed CPAP compliance report with patient.  Encouraged him to continue to work on weight loss.  Patient reports that sometimes when he checks his blood pressure it gives him an alert for an irregular heart rate.  I have encouraged him to discuss this with primary care.  He is at increased risk of having A. fib given his severe obstructive sleep apnea.  Patient reports this happens occasionally like maybe once every month or so.  He is asymptomatic when this happens.  He will follow up with primary care in 3 weeks regarding this.  OSA (obstructive sleep apnea) Patient has had significant weight loss Congratulated patient Reviewed CPAP compliance report with patient today  Plan: Decrease APAP settings to 5-15 Patient to get mask fitting done with DME company: [employer] Patient to follow-up in 1 year Okay to continue take Ambien If patient continues to struggle with poor sleep quality I encouraged him to contact her office to schedule a follow-up with Dr. Jenetta Downer     Return in about 1 year (around 11/24/2020), or if symptoms worsen or fail to improve, for Follow up with Dr. Ander Slade, Follow up  with Wyn Quaker FNP-C.  Lauraine Rinne, NP 11/25/2019   This appointment required 32 minutes of patient care (this includes precharting, chart review, review of results, face-to-face care, etc.).

## 2019-11-25 ENCOUNTER — Encounter: Payer: Self-pay | Admitting: Pulmonary Disease

## 2019-11-25 ENCOUNTER — Ambulatory Visit (INDEPENDENT_AMBULATORY_CARE_PROVIDER_SITE_OTHER): Payer: 59 | Admitting: Pulmonary Disease

## 2019-11-25 ENCOUNTER — Other Ambulatory Visit: Payer: Self-pay

## 2019-11-25 DIAGNOSIS — G4733 Obstructive sleep apnea (adult) (pediatric): Secondary | ICD-10-CM | POA: Diagnosis not present

## 2019-11-25 NOTE — Patient Instructions (Addendum)
You were seen today by Lauraine Rinne, NP  for:   1. OSA (obstructive sleep apnea)  Change APAP setting to 5-15  Order to DME for mask fitting   We recommend that you continue using your CPAP daily >>>Keep up the hard work using your device >>> Goal should be wearing this for the entire night that you are sleeping, at least 4 to 6 hours  Remember:   Do not drive or operate heavy machinery if tired or drowsy.   Please notify the supply company and office if you are unable to use your device regularly due to missing supplies or machine being broken.   Work on maintaining a healthy weight and following your recommended nutrition plan   Maintain proper daily exercise and movement   Maintaining proper use of your device can also help improve management of other chronic illnesses such as: Blood pressure, blood sugars, and weight management.   BiPAP/ CPAP Cleaning:  >>>Clean weekly, with Dawn soap, and bottle brush.  Set up to air dry. >>> Wipe mask out daily with wet wipe or towelette     If you continue to have difficulty with your sleeping please schedule an appointment with Dr. Ander Slade  Please discuss with primary care your concerns that you may potentially have an irregular heartbeat.  They may need to consider referral to cardiology or placing you on a Holter monitor as discussed in our office visit today  Follow Up:    Return in about 1 year (around 11/24/2020), or if symptoms worsen or fail to improve, for Follow up with Dr. Ander Slade, Follow up with Wyn Quaker FNP-C.   Please do your part to reduce the spread of COVID-19:      Reduce your risk of any infection  and COVID19 by using the similar precautions used for avoiding the common cold or flu:   Wash your hands often with soap and warm water for at least 20 seconds.  If soap and water are not readily available, use an alcohol-based hand sanitizer with at least 60% alcohol.   If coughing or sneezing, cover your mouth  and nose by coughing or sneezing into the elbow areas of your shirt or coat, into a tissue or into your sleeve (not your hands).  WEAR A MASK when in public   Avoid shaking hands with others and consider head nods or verbal greetings only.  Avoid touching your eyes, nose, or mouth with unwashed hands.   Avoid close contact with people who are sick.  Avoid places or events with large numbers of people in one location, like concerts or sporting events.  If you have some symptoms but not all symptoms, continue to monitor at home and seek medical attention if your symptoms worsen.  If you are having a medical emergency, call 911.   [place] / e-Visit: [email or web address]         [facility]: [facility]: [phone]                   [facility]: [phone]     It is flu season:   >>> Best ways to protect herself from the flu: Receive the yearly flu vaccine, practice good hand hygiene washing with soap and also using hand sanitizer when available, eat a nutritious meals, get adequate rest, hydrate appropriately   Please contact the office if your symptoms worsen or you have  concerns that you are not improving.   Thank you for choosing [facility] for your healthcare, and for allowing [place] to partner with you on your healthcare journey. I am thankful to be able to provide care to you today.   Wyn Quaker FNP-C

## 2019-11-25 NOTE — Assessment & Plan Note (Signed)
Patient has had significant weight loss Congratulated patient Reviewed CPAP compliance report with patient today  Plan: Decrease APAP settings to 5-15 Patient to get mask fitting done with DME company: [employer] Patient to follow-up in 1 year Okay to continue take Ambien If patient continues to struggle with poor sleep quality I encouraged him to contact her office to schedule a follow-up with Dr. Jenetta Downer

## 2019-12-01 ENCOUNTER — Inpatient Hospital Stay: Admission: RE | Admit: 2019-12-01 | Payer: 59 | Source: Ambulatory Visit

## 2020-01-30 ENCOUNTER — Other Ambulatory Visit: Payer: Self-pay

## 2020-01-30 ENCOUNTER — Encounter (HOSPITAL_COMMUNITY): Payer: Self-pay

## 2020-01-30 ENCOUNTER — Inpatient Hospital Stay (HOSPITAL_COMMUNITY)
Admission: EM | Admit: 2020-01-30 | Discharge: 2020-02-03 | DRG: 418 | Disposition: A | Discharge status: Home / Self Care | Payer: 59 | Attending: Student | Admitting: Student

## 2020-01-30 DIAGNOSIS — G4700 Insomnia, unspecified: Secondary | ICD-10-CM | POA: Diagnosis present

## 2020-01-30 DIAGNOSIS — K851 Biliary acute pancreatitis without necrosis or infection: Principal | ICD-10-CM | POA: Diagnosis present

## 2020-01-30 DIAGNOSIS — Z6829 Body mass index (BMI) 29.0-29.9, adult: Secondary | ICD-10-CM

## 2020-01-30 DIAGNOSIS — Z823 Family history of stroke: Secondary | ICD-10-CM

## 2020-01-30 DIAGNOSIS — Z9049 Acquired absence of other specified parts of digestive tract: Secondary | ICD-10-CM

## 2020-01-30 DIAGNOSIS — Z882 Allergy status to sulfonamides status: Secondary | ICD-10-CM

## 2020-01-30 DIAGNOSIS — K859 Acute pancreatitis without necrosis or infection, unspecified: Secondary | ICD-10-CM | POA: Diagnosis not present

## 2020-01-30 DIAGNOSIS — E669 Obesity, unspecified: Secondary | ICD-10-CM | POA: Diagnosis present

## 2020-01-30 DIAGNOSIS — K219 Gastro-esophageal reflux disease without esophagitis: Secondary | ICD-10-CM | POA: Diagnosis present

## 2020-01-30 DIAGNOSIS — Z20822 Contact with and (suspected) exposure to covid-19: Secondary | ICD-10-CM | POA: Diagnosis present

## 2020-01-30 DIAGNOSIS — Z8249 Family history of ischemic heart disease and other diseases of the circulatory system: Secondary | ICD-10-CM

## 2020-01-30 DIAGNOSIS — I712 Thoracic aortic aneurysm, without rupture: Secondary | ICD-10-CM | POA: Diagnosis present

## 2020-01-30 DIAGNOSIS — Z803 Family history of malignant neoplasm of breast: Secondary | ICD-10-CM

## 2020-01-30 DIAGNOSIS — F329 Major depressive disorder, single episode, unspecified: Secondary | ICD-10-CM | POA: Diagnosis present

## 2020-01-30 DIAGNOSIS — I10 Essential (primary) hypertension: Secondary | ICD-10-CM | POA: Diagnosis present

## 2020-01-30 DIAGNOSIS — Z79899 Other long term (current) drug therapy: Secondary | ICD-10-CM

## 2020-01-30 DIAGNOSIS — G4733 Obstructive sleep apnea (adult) (pediatric): Secondary | ICD-10-CM | POA: Diagnosis present

## 2020-01-30 DIAGNOSIS — Z800 Family history of malignant neoplasm of digestive organs: Secondary | ICD-10-CM

## 2020-01-30 DIAGNOSIS — Z419 Encounter for procedure for purposes other than remedying health state, unspecified: Secondary | ICD-10-CM

## 2020-01-30 DIAGNOSIS — K8010 Calculus of gallbladder with chronic cholecystitis without obstruction: Secondary | ICD-10-CM | POA: Diagnosis present

## 2020-01-30 DIAGNOSIS — M549 Dorsalgia, unspecified: Secondary | ICD-10-CM | POA: Diagnosis present

## 2020-01-30 DIAGNOSIS — I2510 Atherosclerotic heart disease of native coronary artery without angina pectoris: Secondary | ICD-10-CM | POA: Diagnosis present

## 2020-01-30 DIAGNOSIS — R748 Abnormal levels of other serum enzymes: Secondary | ICD-10-CM | POA: Diagnosis present

## 2020-01-30 NOTE — ED Triage Notes (Signed)
Pt coming in c/o sudden onset upper abdominal pain. Took tums and drank baking soda with water without relief.

## 2020-01-31 ENCOUNTER — Encounter (HOSPITAL_COMMUNITY): Payer: Self-pay

## 2020-01-31 ENCOUNTER — Inpatient Hospital Stay (HOSPITAL_COMMUNITY): Payer: 59

## 2020-01-31 ENCOUNTER — Other Ambulatory Visit: Payer: Self-pay

## 2020-01-31 ENCOUNTER — Emergency Department (HOSPITAL_COMMUNITY): Payer: 59

## 2020-01-31 DIAGNOSIS — Z8249 Family history of ischemic heart disease and other diseases of the circulatory system: Secondary | ICD-10-CM | POA: Diagnosis not present

## 2020-01-31 DIAGNOSIS — I712 Thoracic aortic aneurysm, without rupture: Secondary | ICD-10-CM | POA: Diagnosis present

## 2020-01-31 DIAGNOSIS — K859 Acute pancreatitis without necrosis or infection, unspecified: Secondary | ICD-10-CM

## 2020-01-31 DIAGNOSIS — Z800 Family history of malignant neoplasm of digestive organs: Secondary | ICD-10-CM | POA: Diagnosis not present

## 2020-01-31 DIAGNOSIS — G4733 Obstructive sleep apnea (adult) (pediatric): Secondary | ICD-10-CM | POA: Diagnosis present

## 2020-01-31 DIAGNOSIS — R748 Abnormal levels of other serum enzymes: Secondary | ICD-10-CM | POA: Diagnosis present

## 2020-01-31 DIAGNOSIS — F329 Major depressive disorder, single episode, unspecified: Secondary | ICD-10-CM | POA: Diagnosis present

## 2020-01-31 DIAGNOSIS — I10 Essential (primary) hypertension: Secondary | ICD-10-CM | POA: Diagnosis present

## 2020-01-31 DIAGNOSIS — E669 Obesity, unspecified: Secondary | ICD-10-CM | POA: Diagnosis present

## 2020-01-31 DIAGNOSIS — Z20822 Contact with and (suspected) exposure to covid-19: Secondary | ICD-10-CM | POA: Diagnosis present

## 2020-01-31 DIAGNOSIS — Z6829 Body mass index (BMI) 29.0-29.9, adult: Secondary | ICD-10-CM | POA: Diagnosis not present

## 2020-01-31 DIAGNOSIS — Z882 Allergy status to sulfonamides status: Secondary | ICD-10-CM | POA: Diagnosis not present

## 2020-01-31 DIAGNOSIS — Z823 Family history of stroke: Secondary | ICD-10-CM | POA: Diagnosis not present

## 2020-01-31 DIAGNOSIS — K858 Other acute pancreatitis without necrosis or infection: Secondary | ICD-10-CM | POA: Diagnosis not present

## 2020-01-31 DIAGNOSIS — G4700 Insomnia, unspecified: Secondary | ICD-10-CM | POA: Diagnosis present

## 2020-01-31 DIAGNOSIS — Z803 Family history of malignant neoplasm of breast: Secondary | ICD-10-CM | POA: Diagnosis not present

## 2020-01-31 DIAGNOSIS — I2510 Atherosclerotic heart disease of native coronary artery without angina pectoris: Secondary | ICD-10-CM | POA: Diagnosis present

## 2020-01-31 DIAGNOSIS — Z9049 Acquired absence of other specified parts of digestive tract: Secondary | ICD-10-CM | POA: Diagnosis not present

## 2020-01-31 DIAGNOSIS — K8010 Calculus of gallbladder with chronic cholecystitis without obstruction: Secondary | ICD-10-CM | POA: Diagnosis present

## 2020-01-31 DIAGNOSIS — K219 Gastro-esophageal reflux disease without esophagitis: Secondary | ICD-10-CM | POA: Diagnosis present

## 2020-01-31 DIAGNOSIS — K851 Biliary acute pancreatitis without necrosis or infection: Secondary | ICD-10-CM | POA: Diagnosis present

## 2020-01-31 DIAGNOSIS — Z79899 Other long term (current) drug therapy: Secondary | ICD-10-CM | POA: Diagnosis not present

## 2020-01-31 DIAGNOSIS — M549 Dorsalgia, unspecified: Secondary | ICD-10-CM | POA: Diagnosis present

## 2020-01-31 LAB — BASIC METABOLIC PANEL
Anion gap: 6 (ref 5–15)
BUN: 24 mg/dL — ABNORMAL HIGH (ref 6–20)
CO2: 26 mmol/L (ref 22–32)
Calcium: 8.7 mg/dL — ABNORMAL LOW (ref 8.9–10.3)
Chloride: 108 mmol/L (ref 98–111)
Creatinine, Ser: 0.9 mg/dL (ref 0.61–1.24)
GFR calc Af Amer: >60 mL/min (ref >60)
GFR calc non Af Amer: >60 mL/min (ref >60)
Glucose, Bld: 120 mg/dL — ABNORMAL HIGH (ref 70–99)
Potassium: 3.9 mmol/L (ref 3.5–5.1)
Sodium: 140 mmol/L (ref 135–145)

## 2020-01-31 LAB — CBC
HCT: 48.5 % (ref 39.0–52.0)
Hemoglobin: 15.7 g/dL (ref 13.0–17.0)
MCH: 29.9 pg (ref 26.0–34.0)
MCHC: 32.4 g/dL (ref 30.0–36.0)
MCV: 92.4 fL (ref 80.0–100.0)
Platelets: 234 10*3/uL (ref 150–400)
RBC: 5.25 MIL/uL (ref 4.22–5.81)
RDW: 14.6 % (ref 11.5–15.5)
WBC: 11.3 10*3/uL — ABNORMAL HIGH (ref 4.0–10.5)
nRBC: 0 % (ref 0.0–0.2)

## 2020-01-31 LAB — COMPREHENSIVE METABOLIC PANEL
ALT: 59 U/L — ABNORMAL HIGH (ref 0–44)
AST: 106 U/L — ABNORMAL HIGH (ref 15–41)
Albumin: 4.2 g/dL (ref 3.5–5.0)
Alkaline Phosphatase: 102 U/L (ref 38–126)
Anion gap: 11 (ref 5–15)
BUN: 24 mg/dL — ABNORMAL HIGH (ref 6–20)
CO2: 31 mmol/L (ref 22–32)
Calcium: 9.8 mg/dL (ref 8.9–10.3)
Chloride: 101 mmol/L (ref 98–111)
Creatinine, Ser: 0.97 mg/dL (ref 0.61–1.24)
GFR calc Af Amer: >60 mL/min (ref >60)
GFR calc non Af Amer: >60 mL/min (ref >60)
Glucose, Bld: 109 mg/dL — ABNORMAL HIGH (ref 70–99)
Potassium: 4 mmol/L (ref 3.5–5.1)
Sodium: 143 mmol/L (ref 135–145)
Total Bilirubin: 0.9 mg/dL (ref 0.3–1.2)
Total Protein: 6.6 g/dL (ref 6.5–8.1)

## 2020-01-31 LAB — TROPONIN I (HIGH SENSITIVITY)
Troponin I (High Sensitivity): 3 ng/L (ref <18)
Troponin I (High Sensitivity): 3 ng/L (ref <18)

## 2020-01-31 LAB — HEPATIC FUNCTION PANEL
ALT: 197 U/L — ABNORMAL HIGH (ref 0–44)
AST: 226 U/L — ABNORMAL HIGH (ref 15–41)
Albumin: 3.4 g/dL — ABNORMAL LOW (ref 3.5–5.0)
Alkaline Phosphatase: 100 U/L (ref 38–126)
Bilirubin, Direct: 0.3 mg/dL — ABNORMAL HIGH (ref 0.0–0.2)
Indirect Bilirubin: 0.6 mg/dL (ref 0.3–0.9)
Total Bilirubin: 0.9 mg/dL (ref 0.3–1.2)
Total Protein: 5.3 g/dL — ABNORMAL LOW (ref 6.5–8.1)

## 2020-01-31 LAB — CBC WITH DIFFERENTIAL/PLATELET
Abs Immature Granulocytes: 0.06 10*3/uL (ref 0.00–0.07)
Basophils Absolute: 0 10*3/uL (ref 0.0–0.1)
Basophils Relative: 0 %
Eosinophils Absolute: 0 10*3/uL (ref 0.0–0.5)
Eosinophils Relative: 0 %
HCT: 41 % (ref 39.0–52.0)
Hemoglobin: 13.2 g/dL (ref 13.0–17.0)
Immature Granulocytes: 1 %
Lymphocytes Relative: 3 %
Lymphs Abs: 0.3 10*3/uL — ABNORMAL LOW (ref 0.7–4.0)
MCH: 29.9 pg (ref 26.0–34.0)
MCHC: 32.2 g/dL (ref 30.0–36.0)
MCV: 93 fL (ref 80.0–100.0)
Monocytes Absolute: 0.6 10*3/uL (ref 0.1–1.0)
Monocytes Relative: 5 %
Neutro Abs: 10.5 10*3/uL — ABNORMAL HIGH (ref 1.7–7.7)
Neutrophils Relative %: 91 %
Platelets: 152 10*3/uL (ref 150–400)
RBC: 4.41 MIL/uL (ref 4.22–5.81)
RDW: 14.8 % (ref 11.5–15.5)
WBC: 11.4 10*3/uL — ABNORMAL HIGH (ref 4.0–10.5)
nRBC: 0 % (ref 0.0–0.2)

## 2020-01-31 LAB — URINALYSIS, ROUTINE W REFLEX MICROSCOPIC
Bilirubin Urine: NEGATIVE
Glucose, UA: NEGATIVE mg/dL
Hgb urine dipstick: NEGATIVE
Ketones, ur: NEGATIVE mg/dL
Leukocytes,Ua: NEGATIVE
Nitrite: NEGATIVE
Protein, ur: NEGATIVE mg/dL
Specific Gravity, Urine: 1.024 (ref 1.005–1.030)
pH: 7 (ref 5.0–8.0)

## 2020-01-31 LAB — LIPASE, BLOOD: Lipase: 2235 U/L — ABNORMAL HIGH (ref 11–51)

## 2020-01-31 LAB — CBG MONITORING, ED: Glucose-Capillary: 117 mg/dL — ABNORMAL HIGH (ref 70–99)

## 2020-01-31 LAB — TRIGLYCERIDES: Triglycerides: 52 mg/dL (ref <150)

## 2020-01-31 LAB — SARS CORONAVIRUS 2 BY RT PCR (HOSPITAL ORDER, PERFORMED IN ~~LOC~~ HOSPITAL LAB): SARS Coronavirus 2: NEGATIVE

## 2020-01-31 LAB — GLUCOSE, CAPILLARY: Glucose-Capillary: 69 mg/dL — ABNORMAL LOW (ref 70–99)

## 2020-01-31 MED ORDER — ACETAMINOPHEN 650 MG RE SUPP: 650.0000 mg | Freq: Four times a day (QID) | RECTAL | Status: DC | PRN

## 2020-01-31 MED ORDER — IOHEXOL 300 MG/ML  SOLN
100.0000 mL | Freq: Once | INTRAMUSCULAR | Status: AC | PRN
  Administered 2020-01-31: 100 mL via INTRAVENOUS

## 2020-01-31 MED ORDER — IBUPROFEN 200 MG PO TABS
400.0000 mg | ORAL_TABLET | Freq: Once | ORAL | Status: AC
  Administered 2020-01-31: 400 mg via ORAL
  Filled 2020-01-31: qty 2

## 2020-01-31 MED ORDER — LACTATED RINGERS IV SOLN: INTRAVENOUS | Status: AC

## 2020-01-31 MED ORDER — LABETALOL HCL 5 MG/ML IV SOLN
10.0000 mg | INTRAVENOUS | Status: DC | PRN
  Filled 2020-01-31: qty 4

## 2020-01-31 MED ORDER — ALUM & MAG HYDROXIDE-SIMETH 200-200-20 MG/5ML PO SUSP
30.0000 mL | Freq: Once | ORAL | Status: AC
  Administered 2020-01-31: 30 mL via ORAL
  Filled 2020-01-31: qty 30

## 2020-01-31 MED ORDER — SODIUM CHLORIDE (PF) 0.9 % IJ SOLN
INTRAMUSCULAR | Status: AC
  Filled 2020-01-31: qty 50

## 2020-01-31 MED ORDER — SODIUM CHLORIDE 0.9 % IV BOLUS
1000.0000 mL | Freq: Once | INTRAVENOUS | Status: AC
  Administered 2020-01-31: 1000 mL via INTRAVENOUS

## 2020-01-31 MED ORDER — FENTANYL CITRATE (PF) 100 MCG/2ML IJ SOLN
50.0000 ug | Freq: Once | INTRAMUSCULAR | Status: AC
  Administered 2020-01-31: 50 ug via INTRAVENOUS
  Filled 2020-01-31: qty 2

## 2020-01-31 MED ORDER — ONDANSETRON HCL 4 MG/2ML IJ SOLN: 4.0000 mg | Freq: Four times a day (QID) | INTRAMUSCULAR | Status: DC | PRN

## 2020-01-31 MED ORDER — SODIUM CHLORIDE 0.9 % IV SOLN: Freq: Once | INTRAVENOUS | Status: DC

## 2020-01-31 MED ORDER — HYDROMORPHONE HCL 1 MG/ML IJ SOLN
1.0000 mg | INTRAMUSCULAR | Status: DC | PRN
  Administered 2020-01-31: 1 mg via INTRAVENOUS
  Filled 2020-01-31: qty 1

## 2020-01-31 MED ORDER — ONDANSETRON HCL 4 MG PO TABS: 4.0000 mg | ORAL_TABLET | Freq: Four times a day (QID) | ORAL | Status: DC | PRN

## 2020-01-31 MED ORDER — HEPARIN SODIUM (PORCINE) 5000 UNIT/ML IJ SOLN
5000.0000 [IU] | Freq: Three times a day (TID) | INTRAMUSCULAR | Status: DC
  Administered 2020-01-31 – 2020-02-03 (×8): 5000 [IU] via SUBCUTANEOUS
  Filled 2020-01-31 (×8): qty 1

## 2020-01-31 MED ORDER — LIDOCAINE VISCOUS HCL 2 % MT SOLN
15.0000 mL | Freq: Once | OROMUCOSAL | Status: AC
  Administered 2020-01-31: 15 mL via ORAL
  Filled 2020-01-31: qty 15

## 2020-01-31 MED ORDER — ACETAMINOPHEN 325 MG PO TABS
650.0000 mg | ORAL_TABLET | Freq: Four times a day (QID) | ORAL | Status: DC | PRN
  Administered 2020-01-31 – 2020-02-01 (×3): 650 mg via ORAL
  Filled 2020-01-31 (×3): qty 2

## 2020-01-31 MED ORDER — LACTATED RINGERS IV BOLUS
1000.0000 mL | Freq: Once | INTRAVENOUS | Status: AC
  Administered 2020-01-31: 1000 mL via INTRAVENOUS

## 2020-01-31 MED ORDER — ONDANSETRON HCL 4 MG/2ML IJ SOLN
4.0000 mg | Freq: Once | INTRAMUSCULAR | Status: AC
  Administered 2020-01-31: 4 mg via INTRAVENOUS
  Filled 2020-01-31: qty 2

## 2020-01-31 MED ORDER — SODIUM CHLORIDE 0.9 % IV BOLUS
2000.0000 mL | Freq: Once | INTRAVENOUS | Status: AC
  Administered 2020-01-31: 2000 mL via INTRAVENOUS

## 2020-01-31 NOTE — H&P (Addendum)
History and Physical    Andrew Hooper KGU:[id number] DOB: [birth year] DOA: 01/30/2020  PCP: Deland Pretty, MD   Patient coming from: Home.  Chief Complaint: Abdominal pain.  HPI: Andrew Hooper is a 54 y.o. male with history of hypertension, ascending aortic aneurysm presents to the ER with complaints of abdominal pain.  Patient's pain started around 5 PM yesterday after having some chicken and broccoli.  Pain is in epigastric area with some nausea no vomiting no diarrhea.  Pain was persistent and severe and presents to the ER.  ED Course: In the ER patient is mildly febrile with temperature 101.5 F CT abdomen pelvis was consistent with pancreatitis.  Labs show a lipase of 2200 BUN of twenty-four creatinine 1.9 hematocrit of 48.5 high sensitive troponin negative EKG shows normal sinus rhythm.  Covid test is pending.  Patient started on fluids and pain relief medications and admitted for acute pancreatitis.  Review of Systems: As per HPI, rest all negative.   Past Medical History:  Diagnosis Date  . Complication of anesthesia    rash afterwords"  . Diverticulitis   . GERD (gastroesophageal reflux disease)   . Hypertension   . Reflux     Past Surgical History:  Procedure Laterality Date  . BACK SURGERY    . BACK SURGERY    . COLON SURGERY    . COLON SURGERY    . KNEE SURGERY    . KNEE SURGERY    . SHOULDER SURGERY       reports that he has never smoked. He has never used smokeless tobacco. He reports current alcohol use. He reports that he does not use drugs.  Allergies  Allergen Reactions  . Sulfamethoxazole Rash    Sulfa based antibiotics     Family History  Problem Relation Age of Onset  . Breast cancer Mother   . Rectal cancer Mother   . Deep vein thrombosis Mother   . Stroke Father   . Heart disease Father 61  . Heart disease Brother 55    Prior to Admission medications   Medication Sig Start Date End Date Taking? Authorizing Provider  acetaminophen  (TYLENOL) 500 MG tablet Take by mouth.   Yes [provider]  Cholecalciferol 50 MCG (2000 UT) CAPS Take by mouth.   Yes [provider]  escitalopram (LEXAPRO) 10 MG tablet Take 10 mg by mouth daily. 11/12/19  Yes [provider]  irbesartan (AVAPRO) 300 MG tablet Take 300 mg by mouth daily. 11/11/19  Yes [provider]  loratadine (CLARITIN) 10 MG tablet Take 1 tablet by mouth daily as needed for allergies. 10/15/19  Yes [provider]  zolpidem (AMBIEN) 10 MG tablet Take 10 mg by mouth at bedtime as needed for sleep. 12/29/19  Yes [provider]    Physical Exam: Constitutional: Moderately built and nourished. Vitals:   01/31/20 0345 01/31/20 0412 01/31/20 0430 01/31/20 0500  BP: 111/66  (!) 101/54 (!) 100/55  Pulse: 91  (!) 104 (!) 103  Resp: 17  (!) 27 (!) 25  Temp:  (!) 101.1 F (38.4 C)    TempSrc:  Rectal    SpO2: 96%  92% 91%  Weight:      Height:       Eyes: Anicteric no pallor. ENMT: No discharge from the ears eyes nose or mouth. Neck: No mass felt.  No neck rigidity. Respiratory: No rhonchi or crepitations. Cardiovascular: S1-S2 heard. Abdomen: Mild epigastric tenderness no guarding or rigidity. Musculoskeletal: No  edema. Skin: No rash. Neurologic: Alert awake oriented to time place and person.  Moves all extremities. Psychiatric: Appears normal.  Normal affect.   Labs on Admission: I have personally reviewed following labs and imaging studies  CBC: Recent Labs  Lab 01/30/20 2345  WBC 11.3*  HGB 15.7  HCT 48.5  MCV 92.4  PLT 299   Basic Metabolic Panel: Recent Labs  Lab 01/30/20 2345  NA 143  K 4.0  CL 101  CO2 31  GLUCOSE 109*  BUN 24*  CREATININE 0.97  CALCIUM 9.8   GFR: Estimated Creatinine Clearance: 101.3 mL/min (by C-G formula based on SCr of 0.97 mg/dL). Liver Function Tests: Recent Labs  Lab 01/30/20 2345  AST 106*  ALT 59*  ALKPHOS 102  BILITOT 0.9  PROT 6.6  ALBUMIN 4.2    Recent Labs  Lab 01/30/20 2345  LIPASE 2,235*   No results for input(s): AMMONIA in the last 168 hours. Coagulation Profile: No results for input(s): INR, PROTIME in the last 168 hours. Cardiac Enzymes: No results for input(s): CKTOTAL, CKMB, CKMBINDEX, TROPONINI in the last 168 hours. BNP (last 3 results) No results for input(s): PROBNP in the last 8760 hours. HbA1C: No results for input(s): HGBA1C in the last 72 hours. CBG: No results for input(s): GLUCAP in the last 168 hours. Lipid Profile: No results for input(s): CHOL, HDL, LDLCALC, TRIG, CHOLHDL, LDLDIRECT in the last 72 hours. Thyroid Function Tests: No results for input(s): TSH, T4TOTAL, FREET4, T3FREE, THYROIDAB in the last 72 hours. Anemia Panel: No results for input(s): VITAMINB12, FOLATE, FERRITIN, TIBC, IRON, RETICCTPCT in the last 72 hours. Urine analysis: No results found for: COLORURINE, APPEARANCEUR, LABSPEC, PHURINE, GLUCOSEU, HGBUR, BILIRUBINUR, KETONESUR, PROTEINUR, UROBILINOGEN, NITRITE, LEUKOCYTESUR Sepsis Labs: @LABRCNTIP (procalcitonin:4,lacticidven:4) )No results found for this or any previous visit (from the past 240 hour(s)).   Radiological Exams on Admission: CT ABDOMEN PELVIS W CONTRAST  Result Date: 01/31/2020 CLINICAL DATA:  Sudden onset upper abdominal pain EXAM: CT ABDOMEN AND PELVIS WITH CONTRAST TECHNIQUE: Multidetector CT imaging of the abdomen and pelvis was performed using the standard protocol following bolus administration of intravenous contrast. CONTRAST:  161mL OMNIPAQUE IOHEXOL 300 MG/ML  SOLN COMPARISON:  None. FINDINGS: Lower chest: No acute pleural or parenchymal lung disease. Hepatobiliary: No focal liver abnormality is seen. No gallstones, gallbladder wall thickening, or biliary dilatation. Pancreas: There is fat stranding surrounding the head of the pancreas consistent with acute uncomplicated pancreatitis. The pancreatic parenchyma enhances normally. Spleen: Normal in size without  focal abnormality. Adrenals/Urinary Tract: There are bilateral nonobstructing renal calculi, largest on the right measuring 7 mm and on the left measuring 9 mm. No obstructive uropathy within either kidney. The ureters and bladder are grossly normal. Adrenals are unremarkable. Stomach/Bowel: No bowel obstruction or ileus. Normal appendix right lower quadrant. Postsurgical changes from prior sigmoid colon resection and reanastomosis. No bowel wall thickening. Fat stranding surrounding the duodenum likely related to the pancreatitis described above. Vascular/Lymphatic: There is minimal atherosclerosis of the distal aorta. No pathologic adenopathy within the abdomen or pelvis. Reproductive: Prostate is unremarkable. Other: Trace free fluid in the right upper quadrant. No free intraperitoneal gas. No fluid collection, pseudocyst, or abscess. Musculoskeletal: No acute or destructive bony lesions. Reconstructed images demonstrate no additional findings. IMPRESSION: 1. Mild inflammatory changes surrounding the head of the pancreas, consistent with acute uncomplicated pancreatitis. 2. Bilateral nonobstructing renal calculi. Electronically Signed   By: Randa Ngo M.D.   On: 01/31/2020 03:13    EKG: Independently reviewed.  Normal  sinus rhythm.  Assessment/Plan Principal Problem:   Acute pancreatitis Active Problems:   Essential hypertension    1. Acute pancreatitis cause not clear.  Patient denies drinking any alcohol.  CT scan does not show any definite gallstones.  Patient was recently placed on Lexapro 3 months ago and another medicine change was Avapro.  Will check triglycerides repeat labs keep patient n.p.o. IV fluids pain really medication consult GI.  Patient is known to Dr. Benson Norway gastroenterologist. 2. Elevated LFTs could be from pancreatitis.  May need MRCP if persistently elevated. 3. History of hypertension we will keep patient on as needed IV labetalol. 4. History of ascending aortic aneurysm  denies any chest pain. 5. Fever source not clear.  Could be from pancreatitis.  Covid test is pending.  Covid test is pending.  Patient with acute pancreatitis will need close monitoring for any further worsening in inpatient status.   DVT prophylaxis: Heparin. Code Status: Full code. Family Communication: Discussed with patient.  Disposition Plan: Home. Consults called: None. Admission status: Inpatient.   Rise Patience MD [facility] Pager [phone].  If 7PM-7AM, please contact night-coverage [email or web address] Password [email or web address]  01/31/2020, 5:19 AM

## 2020-01-31 NOTE — Plan of Care (Signed)

## 2020-01-31 NOTE — ED Provider Notes (Signed)
Emergency Department Provider Note  I have reviewed the triage vital signs and the nursing notes.  HISTORY  Chief Complaint Abdominal Pain   HPI Andrew Hooper is a 54 y.o. male with medical problems as documented below who presents to the emergency department today secondary to epigastric pain.  Patient states he last ate around 5:00.  All he had was a chicken breast, broccoli and spaghetti sauce on top of chicken breast.  Patient states a couple hours prior to arrival here he had a relatively acute onset of sharp epigastric pain.  He will intermittently have some nausea but no vomiting.  He will intermittently have some diaphoresis as well.  He said initially he was having quite a bit of burping and once or twice it did seem to help with his symptoms but he has not been burping recently and still hurts him.  States that he did feel like his abdomen was more tight earlier and is looser now (feel he is describing distention.).   Denies alcohol, drugs, anti-inflammatories and is not particularly stressed out.  Compliant with medicines.  No other associated or modifying symptoms.    Past Medical History:  Diagnosis Date  . Complication of anesthesia    rash afterwords"  . Diverticulitis   . GERD (gastroesophageal reflux disease)   . Hypertension   . Reflux     Patient Active Problem List   Diagnosis Date Noted  . OSA (obstructive sleep apnea) 11/25/2019  . Family history of ischemic heart disease 10/27/2013  . Obesity, unspecified 10/27/2013  . Chest pain 06/19/2011  . Left shoulder pain 06/19/2011    Past Surgical History:  Procedure Laterality Date  . BACK SURGERY    . BACK SURGERY    . COLON SURGERY    . COLON SURGERY    . KNEE SURGERY    . KNEE SURGERY    . SHOULDER SURGERY      Current Outpatient Rx  . Order #: [id number] Class: Historical Med  . Order #: [id number] Class: Historical Med  . Order #: [id number] Class: Historical Med  . Order #: [id number] Class:  Historical Med  . Order #: [id number] Class: Historical Med  . Order #: [id number] Class: Historical Med    Allergies Sulfamethoxazole  Family History  Problem Relation Age of Onset  . Breast cancer Mother   . Rectal cancer Mother   . Deep vein thrombosis Mother   . Stroke Father   . Heart disease Father 33  . Heart disease Brother 89    Social History Social History   Tobacco Use  . Smoking status: Never Smoker  . Smokeless tobacco: Never Used  Substance Use Topics  . Alcohol use: Yes    Comment: rarely  . Drug use: No    Review of Systems  All other systems negative except as documented in the HPI. All pertinent positives and negatives as reviewed in the HPI. ____________________________________________  PHYSICAL EXAM:  VITAL SIGNS: ED Triage Vitals  Enc Vitals Group     BP 01/30/20 2325 (!) 146/95     Pulse Rate 01/30/20 2325 76     Resp 01/30/20 2325 20     Temp 01/30/20 2325 97.9 F (36.6 C)     Temp Source 01/30/20 2325 Oral     SpO2 01/30/20 2325 100 %     Weight 01/30/20 2325 215 lb (97.5 kg)     Height 01/30/20 2325 5\' 11"  (1.803 m)    Constitutional: Alert and oriented. Well appearing and in no  acute distress. Eyes: Conjunctivae are normal. PERRL. EOMI. Head: Atraumatic. Nose: No congestion/rhinnorhea. Mouth/Throat: Mucous membranes are moist.  Oropharynx non-erythematous. Neck: No stridor.  No meningeal signs.   Cardiovascular: Normal rate, regular rhythm. Good peripheral circulation. Grossly normal heart sounds.   Respiratory: Normal respiratory effort.  No retractions. Lungs CTAB. Gastrointestinal: Soft and mild epigastric ttp. No distention.  Musculoskeletal: No lower extremity tenderness nor edema. No gross deformities of extremities. Neurologic:  Normal speech and language. No gross focal neurologic deficits are appreciated.  Skin:  Skin is warm, dry and intact. No rash noted.  ____________________________________________   LABS (all labs  ordered are listed, but only abnormal results are displayed)  Labs Reviewed  LIPASE, BLOOD  COMPREHENSIVE METABOLIC PANEL  CBC  URINALYSIS, ROUTINE W REFLEX MICROSCOPIC   ____________________________________________  EKG   EKG Interpretation  Date/Time:    Ventricular Rate:    PR Interval:    QRS Duration:   QT Interval:    QTC Calculation:   R Axis:     Text Interpretation:         ____________________________________________  RADIOLOGY  No results found. ____________________________________________  PROCEDURES  Procedure(s) performed:   Procedures ____________________________________________  INITIAL IMPRESSION / ASSESSMENT AND PLAN / ED COURSE   This patient presents to the ED for concern of epigastric pain, this involves an extensive number of treatment options, and is a complaint that carries with it a high risk of complications and morbidity.  The differential diagnosis includes peptic ulcer, cholecystitis, pancreatitis, esophagitis, gastritis, aortic dissection, ACS or gaseous distention of bowel.     Lab Tests:   I Ordered, reviewed, and interpreted labs, which included significangly elevated lipase, otherwise relatively normal   Medicines ordered:   I ordered medication fentanyl/dilaudid  For pain, fluids for pancreatitis.    Imaging Studies ordered:   I independently visualized and interpreted imaging ct scan which showed pancreatic inflammaion without other obvious etiologies for pancreatits.     I consulted hospitalist  and discussed lab and imaging findings for admission. Asked for more fluids and to add on triglycerides.   Reevaluation:  After the interventions stated above, I reevaluated the patient and found improved pain but now febrile. Will keep npo, admit.   A medical screening exam was performed and I feel the patient has had an appropriate workup for their chief complaint at this time and likelihood of emergent condition  existing is low. They have been counseled on decision, discharge, follow up and which symptoms necessitate immediate return to the emergency department. They or their family verbally stated understanding and agreement with plan and discharged in stable condition.   ____________________________________________  FINAL CLINICAL IMPRESSION(S) / ED DIAGNOSES  Final diagnoses:  None    MEDICATIONS GIVEN DURING THIS VISIT:  Medications  alum & mag hydroxide-simeth (MAALOX/MYLANTA) 200-200-20 MG/5ML suspension 30 mL (has no administration in time range)    And  lidocaine (XYLOCAINE) 2 % viscous mouth solution 15 mL (has no administration in time range)  fentaNYL (SUBLIMAZE) injection 50 mcg (has no administration in time range)  lactated ringers bolus 1,000 mL (has no administration in time range)  ondansetron (ZOFRAN) injection 4 mg (has no administration in time range)    NEW OUTPATIENT MEDICATIONS STARTED DURING THIS VISIT:  New Prescriptions   No medications on file    Note:  This note was prepared with assistance of Dragon voice recognition software. Occasional wrong-word or sound-a-like substitutions may have occurred due to the inherent limitations of voice recognition  software.   Savayah Waltrip, Corene Cornea, MD 01/31/20 [phone]

## 2020-01-31 NOTE — Consult Note (Addendum)
Reason for Consult: Acute pancreatitis. Referring Physician: [facility].  Andrew Hooper is an 54 y.o. male.  HPI: Andrew Hooper is a 54 year old white male, who presents to [facility] yesterday with acute abdominal pain after dinner. He was noted to have a lipase of 2235 and a CT scan showed pancreatitis and therefore was hospitalized for further evaluation.  He denies having any such problems in the past. He has had a history of reflux and has known history of diverticulosis. He claims his reflux is improved after the 40 to 50 pound weight loss over the last couple of years. His last colonoscopy was done in 2017 when a few adenomatous polyps were removed. He is due to have another colonoscopy in April 2022 for surveillance purposes. He had a partial colectomy when he lived in [place] for complicated diverticulitis. His mother had rectal cancer but he denies a family history of colon or esophageal cancer.  Patient takes an Aleve PM every night to help with sleep as he has problems with back pain from inflamed disc.  Past Medical History:  Diagnosis Date  . Complication of anesthesia    rash afterwords"  .  Diverticulosis/diverticulitis   . GERD (gastroesophageal reflux disease)   . Hypertension    Sleeps sleep apnea with AHI of 67.8-uses CPAP at night   .  Ascending aortic aneurysm-4.5 cm    Past Surgical History:  Procedure Laterality Date  . BACK SURGERY    . BACK SURGERY    . COLON SURGERY    . COLON SURGERY    . KNEE SURGERY    . KNEE SURGERY    . SHOULDER SURGERY     Family History  Problem Relation Age of Onset  . Breast cancer Mother   . Rectal cancer Mother   . Deep vein thrombosis Mother   . Stroke Father   . Heart disease Father 64  . Heart disease Brother 87   Social History:  reports that he has never smoked. He has never used smokeless tobacco. He reports very little alcohol use, a few times a month. He reports that he does not use  drugs.  Allergies:  Allergies  Allergen Reactions  . Sulfamethoxazole Rash    Sulfa based antibiotics    Medications: I have reviewed the patient's current medications.  Results for orders placed or performed during the hospital encounter of 01/30/20 (from the past 48 hour(s))  Lipase, blood     Status: Abnormal   Collection Time: 01/30/20 11:45 PM  Result Value Ref Range   Lipase 2,235 (H) 11 - 51 U/L    Comment: RESULTS CONFIRMED BY MANUAL DILUTION Performed at [facility] [address] [place], [place] [address]   Comprehensive metabolic panel     Status: Abnormal   Collection Time: 01/30/20 11:45 PM  Result Value Ref Range   Sodium 143 135 - 145 mmol/L   Potassium 4.0 3.5 - 5.1 mmol/L   Chloride 101 98 - 111 mmol/L   CO2 31 22 - 32 mmol/L   Glucose, Bld 109 (H) 70 - 99 mg/dL    Comment: Glucose reference range applies only to samples taken after fasting for at least 8 hours.   BUN 24 (H) 6 - 20 mg/dL   Creatinine, Ser 0.97 0.61 - 1.24 mg/dL   Calcium 9.8 8.9 - 10.3 mg/dL   Total Protein 6.6 6.5 - 8.1 g/dL   Albumin 4.2 3.5 - 5.0 g/dL   AST 106 (H)  15 - 41 U/L   ALT 59 (H) 0 - 44 U/L   Alkaline Phosphatase 102 38 - 126 U/L   Total Bilirubin 0.9 0.3 - 1.2 mg/dL   GFR calc non Af Amer >60 >60 mL/min   GFR calc Af Amer >60 >60 mL/min   Anion gap 11 5 - 15    Comment: Performed at [facility], [place] [address] [place], [place] [address]  CBC     Status: Abnormal   Collection Time: 01/30/20 11:45 PM  Result Value Ref Range   WBC 11.3 (H) 4.0 - 10.5 K/uL   RBC 5.25 4.22 - 5.81 MIL/uL   Hemoglobin 15.7 13.0 - 17.0 g/dL   HCT 48.5 39 - 52 %   MCV 92.4 80.0 - 100.0 fL   MCH 29.9 26.0 - 34.0 pg   MCHC 32.4 30.0 - 36.0 g/dL   RDW 14.6 11.5 - 15.5 %   Platelets 234 150 - 400 K/uL   nRBC 0.0 0.0 - 0.2 %    Comment: Performed at [facility], [place] [address] [place], [place] [address]  Troponin I (High  Sensitivity)     Status: None   Collection Time: 01/31/20 12:25 AM  Result Value Ref Range   Troponin I (High Sensitivity) 3 <18 ng/L    Comment: (NOTE) Elevated high sensitivity troponin I (hsTnI) values and significant  changes across serial measurements may suggest ACS but many other  chronic and acute conditions are known to elevate hsTnI results.  Refer to the "Links" section for chest pain algorithms and additional  guidance. Performed at [facility], [place] [address], [place], [place] [address]   Troponin I (High Sensitivity)     Status: None   Collection Time: 01/31/20  3:13 AM  Result Value Ref Range   Troponin I (High Sensitivity) 3 <18 ng/L    Comment: (NOTE) Elevated high sensitivity troponin I (hsTnI) values and significant  changes across serial measurements may suggest ACS but many other  chronic and acute conditions are known to elevate hsTnI results.  Refer to the "Links" section for chest pain algorithms and additional  guidance. Performed at [facility], [place] [address], [place], [place] [address]   Triglycerides     Status: None   Collection Time: 01/31/20  3:13 AM  Result Value Ref Range   Triglycerides 52 <150 mg/dL    Comment: Performed at [facility], [place] [address], [place], [place] [address]  SARS Coronavirus 2 by RT PCR (hospital order, performed in [facility] hospital lab) Nasopharyngeal Nasopharyngeal Swab     Status: None   Collection Time: 01/31/20  4:19 AM   Specimen: Nasopharyngeal Swab  Result Value Ref Range   SARS Coronavirus 2 NEGATIVE NEGATIVE    Comment: (NOTE) SARS-CoV-2 target nucleic acids are NOT DETECTED.  The SARS-CoV-2 RNA is generally detectable in upper and lower respiratory specimens during the acute phase of infection. The lowest concentration of SARS-CoV-2 viral copies this assay can detect is 250 copies / mL. A negative result does not preclude SARS-CoV-2  infection and should not be used as the sole basis for treatment or other patient management decisions.  A negative result may occur with improper specimen collection / handling, submission of specimen other than nasopharyngeal swab, presence of viral mutation(s) within the areas targeted by this assay, and inadequate number of viral copies (<250 copies / mL). A negative result must be combined with clinical  observations, patient history, and epidemiological information.  Fact Sheet for Patients:   StrictlyIdeas.no  Fact Sheet for Healthcare Providers: BankingDealers.co.za  This test is not yet approved or  cleared by the [place] FDA and has been authorized for detection and/or diagnosis of SARS-CoV-2 by FDA under an Emergency Use Authorization (EUA).  This EUA will remain in effect (meaning this test can be used) for the duration of the COVID-19 declaration under Section 564(b)(1) of the Act, 21 U.S.C. section 360bbb-3(b)(1), unless the authorization is terminated or revoked sooner.  Performed at [facility], [place] [address] [place], [place] [address]   Basic metabolic panel     Status: Abnormal   Collection Time: 01/31/20  5:37 AM  Result Value Ref Range   Sodium 140 135 - 145 mmol/L   Potassium 3.9 3.5 - 5.1 mmol/L   Chloride 108 98 - 111 mmol/L   CO2 26 22 - 32 mmol/L   Glucose, Bld 120 (H) 70 - 99 mg/dL    Comment: Glucose reference range applies only to samples taken after fasting for at least 8 hours.   BUN 24 (H) 6 - 20 mg/dL   Creatinine, Ser 0.90 0.61 - 1.24 mg/dL   Calcium 8.7 (L) 8.9 - 10.3 mg/dL   GFR calc non Af Amer >60 >60 mL/min   GFR calc Af Amer >60 >60 mL/min   Anion gap 6 5 - 15    Comment: Performed at [facility], [place] [address], [place], [place] [address]  CBC with Differential/Platelet     Status: Abnormal   Collection Time: 01/31/20  5:37 AM  Result Value  Ref Range   WBC 11.4 (H) 4.0 - 10.5 K/uL   RBC 4.41 4.22 - 5.81 MIL/uL   Hemoglobin 13.2 13.0 - 17.0 g/dL   HCT 41.0 39 - 52 %   MCV 93.0 80.0 - 100.0 fL   MCH 29.9 26.0 - 34.0 pg   MCHC 32.2 30.0 - 36.0 g/dL   RDW 14.8 11.5 - 15.5 %   Platelets 152 150 - 400 K/uL   nRBC 0.0 0.0 - 0.2 %   Neutrophils Relative % 91 %   Neutro Abs 10.5 (H) 1.7 - 7.7 K/uL   Lymphocytes Relative 3 %   Lymphs Abs 0.3 (L) 0.7 - 4.0 K/uL   Monocytes Relative 5 %   Monocytes Absolute 0.6 0 - 1 K/uL   Eosinophils Relative 0 %   Eosinophils Absolute 0.0 0 - 0 K/uL   Basophils Relative 0 %   Basophils Absolute 0.0 0 - 0 K/uL   Immature Granulocytes 1 %   Abs Immature Granulocytes 0.06 0.00 - 0.07 K/uL    Comment: Performed at [facility], [place] [address] [place], [place] [address]  Hepatic function panel     Status: Abnormal   Collection Time: 01/31/20  5:37 AM  Result Value Ref Range   Total Protein 5.3 (L) 6.5 - 8.1 g/dL   Albumin 3.4 (L) 3.5 - 5.0 g/dL   AST 226 (H) 15 - 41 U/L   ALT 197 (H) 0 - 44 U/L   Alkaline Phosphatase 100 38 - 126 U/L   Total Bilirubin 0.9 0.3 - 1.2 mg/dL   Bilirubin, Direct 0.3 (H) 0.0 - 0.2 mg/dL   Indirect Bilirubin 0.6 0.3 - 0.9 mg/dL    Comment: Performed at [facility], [place] [address] [place], [place] [address]  Urinalysis, Routine w reflex microscopic  Status: None   Collection Time: 01/31/20  6:05 AM  Result Value Ref Range   Color, Urine YELLOW YELLOW   APPearance CLEAR CLEAR   Specific Gravity, Urine 1.024 1.005 - 1.030   pH 7.0 5.0 - 8.0   Glucose, UA NEGATIVE NEGATIVE mg/dL   Hgb urine dipstick NEGATIVE NEGATIVE   Bilirubin Urine NEGATIVE NEGATIVE   Ketones, ur NEGATIVE NEGATIVE mg/dL   Protein, ur NEGATIVE NEGATIVE mg/dL   Nitrite NEGATIVE NEGATIVE   Leukocytes,Ua NEGATIVE NEGATIVE    Comment: Performed at [facility], [place] [address] [place], [place] [address]  CBG monitoring, ED      Status: Abnormal   Collection Time: 01/31/20  7:39 AM  Result Value Ref Range   Glucose-Capillary 117 (H) 70 - 99 mg/dL    Comment: Glucose reference range applies only to samples taken after fasting for at least 8 hours.   CT ABDOMEN PELVIS W CONTRAST  Result Date: 01/31/2020 CLINICAL DATA:  Sudden onset upper abdominal pain EXAM: CT ABDOMEN AND PELVIS WITH CONTRAST TECHNIQUE: Multidetector CT imaging of the abdomen and pelvis was performed using the standard protocol following bolus administration of intravenous contrast. CONTRAST:  170mL OMNIPAQUE IOHEXOL 300 MG/ML  SOLN COMPARISON:  None. FINDINGS: Lower chest: No acute pleural or parenchymal lung disease. Hepatobiliary: No focal liver abnormality is seen. No gallstones, gallbladder wall thickening, or biliary dilatation. Pancreas: There is fat stranding surrounding the head of the pancreas consistent with acute uncomplicated pancreatitis. The pancreatic parenchyma enhances normally. Spleen: Normal in size without focal abnormality. Adrenals/Urinary Tract: There are bilateral nonobstructing renal calculi, largest on the right measuring 7 mm and on the left measuring 9 mm. No obstructive uropathy within either kidney. The ureters and bladder are grossly normal. Adrenals are unremarkable. Stomach/Bowel: No bowel obstruction or ileus. Normal appendix right lower quadrant. Postsurgical changes from prior sigmoid colon resection and reanastomosis. No bowel wall thickening. Fat stranding surrounding the duodenum likely related to the pancreatitis described above. Vascular/Lymphatic: There is minimal atherosclerosis of the distal aorta. No pathologic adenopathy within the abdomen or pelvis. Reproductive: Prostate is unremarkable. Other: Trace free fluid in the right upper quadrant. No free intraperitoneal gas. No fluid collection, pseudocyst, or abscess. Musculoskeletal: No acute or destructive bony lesions. Reconstructed images demonstrate no additional  findings. IMPRESSION: 1. Mild inflammatory changes surrounding the head of the pancreas, consistent with acute uncomplicated pancreatitis. 2. Bilateral nonobstructing renal calculi. Electronically Signed   By: Randa Ngo M.D.   On: 01/31/2020 03:13   Review of Systems  HENT: Negative.   Respiratory: Negative.   Cardiovascular: Negative.   Gastrointestinal: Positive for abdominal pain. Negative for abdominal distention, blood in stool and constipation.  Genitourinary: Negative.   Musculoskeletal: Positive for arthralgias and back pain.  Neurological: Negative.   Hematological: Negative.   Psychiatric/Behavioral: Negative.    Blood pressure 110/70, pulse 80, temperature 98.2 F (36.8 C), resp. rate 16, height 5\' 11"  (1.803 m), weight 97.5 kg, SpO2 98 %. Physical Exam Constitutional:      General: He is not in acute distress.    Appearance: He is well-developed. He is obese. He is not ill-appearing, toxic-appearing or diaphoretic.  HENT:     Head: Normocephalic and atraumatic.     Mouth/Throat:     Mouth: Mucous membranes are moist.     Pharynx: Oropharynx is clear.  Eyes:     Extraocular Movements: Extraocular movements intact.     Pupils: Pupils are equal, round, and reactive to light.  Cardiovascular:     Rate and Rhythm: Normal rate and regular rhythm.     Heart sounds: Normal heart sounds.  Pulmonary:     Effort: Pulmonary effort is normal.  Abdominal:     General: Abdomen is protuberant. Bowel sounds are normal.     Palpations: Abdomen is soft. There is no shifting dullness, fluid wave, hepatomegaly, splenomegaly, mass or pulsatile mass.     Tenderness: There is abdominal tenderness in the right upper quadrant, epigastric area, periumbilical area and left upper quadrant.     Hernia: No hernia is present.  Skin:    General: Skin is warm and dry.  Neurological:     General: No focal deficit present.     Mental Status: He is alert.   Assessment/Plan: 1) Acute  pancreatitis-etiology unclear-abdominal ultrasound has been ordered if he has gallstones patient will be seen by the surgeons prior to discharge; a low-fat diet has been recommended for now. 2) History of adenomatous polyps and a family history of rectal cancer in his mother-patient will be reminded of her repeat colonoscopy in April 2022. 3) GERD. 4) History of complicated diverticulitis resulting in a sigmoid colectomy. 5) Elevated liver enzymes-transaminases ?gallstones. 6) Hypertension. 7) Ascending aortic aneurysm. 8) Back pain/insomnia from degenerative disc disease uses Advil PM  Annamary Buschman 01/31/2020, 3:34 PM

## 2020-01-31 NOTE — ED Notes (Signed)
Report given to Susan, RN

## 2020-01-31 NOTE — Progress Notes (Signed)
Seen and examined and agree with plan of care as per my partner who saw this patient this morning  Essentially 54 year old white male BMI 29 OSA on CPAP AHI 67.8 with successful 45 pound weight loss recently HTN Depression on Lexapro Previously on testosterone replacement?  Under care of the [facility] clinic last [facility] and LH were in normal range last labs show free testosterone 5.5-it appears he is being treated for hypogonadism but he did not do well on testosterone injection nor the gels and there is talk of him probably getting testosterone pellets History of ascending aortic aneurysm 4.5 to 4.4 cm   premature coronary disease followed by Dr. Debara Pickett  Admitted for pancreatitis of unknown cause Lipase was 2000 CT showed no specific necrosis  Patient looks comfortable at bedside tolerating movement but has not been started on diet when I saw him this morning He has no chest pain and is willing to try a liquid diet I spoke to Dr. Collene Mares who will be seeing him and she requests that an ultrasound be done Triglycerides are only 52 which rules out hypertriglyceridemia as a cause I do not know if testosterone replacement can cause gallstones?  But the patient is not using it at this time  We will follow along expectantly   Verneita Griffes, MD [facility] 1:23 PM

## 2020-02-01 LAB — CBC WITH DIFFERENTIAL/PLATELET
Abs Immature Granulocytes: 0.01 10*3/uL (ref 0.00–0.07)
Basophils Absolute: 0 10*3/uL (ref 0.0–0.1)
Basophils Relative: 0 %
Eosinophils Absolute: 0.1 10*3/uL (ref 0.0–0.5)
Eosinophils Relative: 1 %
HCT: 38.4 % — ABNORMAL LOW (ref 39.0–52.0)
Hemoglobin: 12.1 g/dL — ABNORMAL LOW (ref 13.0–17.0)
Immature Granulocytes: 0 %
Lymphocytes Relative: 15 %
Lymphs Abs: 1.2 10*3/uL (ref 0.7–4.0)
MCH: 29.7 pg (ref 26.0–34.0)
MCHC: 31.5 g/dL (ref 30.0–36.0)
MCV: 94.1 fL (ref 80.0–100.0)
Monocytes Absolute: 0.6 10*3/uL (ref 0.1–1.0)
Monocytes Relative: 8 %
Neutro Abs: 6.1 10*3/uL (ref 1.7–7.7)
Neutrophils Relative %: 76 %
Platelets: 118 10*3/uL — ABNORMAL LOW (ref 150–400)
RBC: 4.08 MIL/uL — ABNORMAL LOW (ref 4.22–5.81)
RDW: 15.3 % (ref 11.5–15.5)
WBC: 8 10*3/uL (ref 4.0–10.5)
nRBC: 0 % (ref 0.0–0.2)

## 2020-02-01 LAB — COMPREHENSIVE METABOLIC PANEL
ALT: 162 U/L — ABNORMAL HIGH (ref 0–44)
AST: 86 U/L — ABNORMAL HIGH (ref 15–41)
Albumin: 3.1 g/dL — ABNORMAL LOW (ref 3.5–5.0)
Alkaline Phosphatase: 85 U/L (ref 38–126)
Anion gap: 6 (ref 5–15)
BUN: 20 mg/dL (ref 6–20)
CO2: 26 mmol/L (ref 22–32)
Calcium: 8.4 mg/dL — ABNORMAL LOW (ref 8.9–10.3)
Chloride: 104 mmol/L (ref 98–111)
Creatinine, Ser: 0.96 mg/dL (ref 0.61–1.24)
GFR calc Af Amer: >60 mL/min (ref >60)
GFR calc non Af Amer: >60 mL/min (ref >60)
Glucose, Bld: 76 mg/dL (ref 70–99)
Potassium: 3.9 mmol/L (ref 3.5–5.1)
Sodium: 136 mmol/L (ref 135–145)
Total Bilirubin: 1.1 mg/dL (ref 0.3–1.2)
Total Protein: 5.3 g/dL — ABNORMAL LOW (ref 6.5–8.1)

## 2020-02-01 LAB — GLUCOSE, CAPILLARY
Glucose-Capillary: 68 mg/dL — ABNORMAL LOW (ref 70–99)
Glucose-Capillary: 81 mg/dL (ref 70–99)

## 2020-02-01 LAB — SURGICAL PCR SCREEN
MRSA, PCR: NEGATIVE
Staphylococcus aureus: NEGATIVE

## 2020-02-01 LAB — PROTIME-INR
INR: 1.3 — ABNORMAL HIGH (ref 0.8–1.2)
Prothrombin Time: 15.8 seconds — ABNORMAL HIGH (ref 11.4–15.2)

## 2020-02-01 LAB — HIV ANTIBODY (ROUTINE TESTING W REFLEX): HIV Screen 4th Generation wRfx: NONREACTIVE

## 2020-02-01 LAB — LIPASE, BLOOD: Lipase: 1033 U/L — ABNORMAL HIGH (ref 11–51)

## 2020-02-01 MED ORDER — LACTATED RINGERS IV SOLN: INTRAVENOUS | Status: DC

## 2020-02-01 MED ORDER — SIMETHICONE 80 MG PO CHEW
80.0000 mg | CHEWABLE_TABLET | Freq: Four times a day (QID) | ORAL | Status: DC | PRN
  Administered 2020-02-01: 22:00:00 80 mg via ORAL
  Filled 2020-02-01: qty 1

## 2020-02-01 MED ORDER — ESCITALOPRAM OXALATE 10 MG PO TABS
10.0000 mg | ORAL_TABLET | Freq: Every day | ORAL | Status: DC
  Administered 2020-02-01 – 2020-02-03 (×3): 10 mg via ORAL
  Filled 2020-02-01 (×3): qty 1

## 2020-02-01 NOTE — Progress Notes (Signed)
NP Sharlet Salina was paged because is asking for Lexapro 10 mg that he takes and something for bloating.

## 2020-02-01 NOTE — Consult Note (Signed)
[facility] Surgery Consult Note  Andrew Hooper 1965-08-16  [id number].    Requesting MD: Verlon Au Chief Complaint/Reason for Consult: gallstone pancreatitis HPI:  Patient is a 54 year old male who was admitted yesterday with abdominal pain. Pain is in epigastric abdomen and is a dull aching. Pain has been persistent and severe. Associated fever with rigors and nausea. Denies chest pain, SOB, vomiting, diarrhea, constipation, urinary symptoms. PMH significant for HTN, GERD, Ascending aortic aneurysm, Hx of diverticulitis. Past abdominal surgery includes colon resection with primary anastomosis for diverticulitis 20 years ago. He does not take any blood thinners. Allergic to sulfa drugs. He works for the [employer] [place]. He drinks alcohol rarely and denies tobacco or illicit drug use. He has been vaccinated against COVID-19.   ROS: Review of Systems  Constitutional: Positive for fever. Negative for chills.  Respiratory: Negative for shortness of breath and wheezing.   Cardiovascular: Negative for chest pain and palpitations.  Gastrointestinal: Positive for abdominal pain and nausea. Negative for constipation, diarrhea and vomiting.  Genitourinary: Negative for dysuria, frequency and urgency.  All other systems reviewed and are negative.   Family History  Problem Relation Age of Onset  . Breast cancer Mother   . Rectal cancer Mother   . Deep vein thrombosis Mother   . Stroke Father   . Heart disease Father 9  . Heart disease Brother 37    Past Medical History:  Diagnosis Date  . Complication of anesthesia    rash afterwords"  . Diverticulitis   . GERD (gastroesophageal reflux disease)   . Hypertension   . Reflux     Past Surgical History:  Procedure Laterality Date  . BACK SURGERY    . BACK SURGERY    . COLON SURGERY    . COLON SURGERY    . KNEE SURGERY    . KNEE SURGERY    . SHOULDER SURGERY      Social History:  reports that he has never smoked. He has  never used smokeless tobacco. He reports current alcohol use. He reports that he does not use drugs.  Allergies:  Allergies  Allergen Reactions  . Sulfamethoxazole Rash    Sulfa based antibiotics     Medications Prior to Admission  Medication Sig Dispense Refill  . acetaminophen (TYLENOL) 500 MG tablet Take by mouth.    . Cholecalciferol 50 MCG (2000 UT) CAPS Take by mouth.    . escitalopram (LEXAPRO) 10 MG tablet Take 10 mg by mouth daily.    . irbesartan (AVAPRO) 300 MG tablet Take 300 mg by mouth daily.    Marland Kitchen loratadine (CLARITIN) 10 MG tablet Take 1 tablet by mouth daily as needed for allergies.    Marland Kitchen zolpidem (AMBIEN) 10 MG tablet Take 10 mg by mouth at bedtime as needed for sleep.      Blood pressure (!) 94/54, pulse 79, temperature 99 F (37.2 C), resp. rate 15, height $RemoveBe'5\' 11"'moDvVyPUM$  (1.803 m), weight 97.5 kg, SpO2 94 %. Physical Exam:  General: pleasant, WD, overweight male who is laying in bed in NAD HEENT: Sclera are anicteric.  PERRL.  Ears and nose without any masses or lesions.  Mouth is pink and moist Heart: regular, rate, and rhythm.  Normal s1,s2. No obvious murmurs, gallops, or rubs noted.  Palpable radial and pedal pulses bilaterally Lungs: CTAB, no wheezes, rhonchi, or rales noted.  Respiratory effort nonlabored Abd: soft, mildly ttp in epigastric abdomen with some fullness on palpation, ND, +BS, no masses, hernias,  or organomegaly MS: all 4 extremities are symmetrical with no cyanosis, clubbing, or edema. Skin: warm and dry with no masses, lesions, or rashes Neuro: Cranial nerves 2-12 grossly intact, sensation grossly intact throughout  Psych: A&Ox3 with an appropriate affect.   Results for orders placed or performed during the hospital encounter of 01/30/20 (from the past 48 hour(s))  Lipase, blood     Status: Abnormal   Collection Time: 01/30/20 11:45 PM  Result Value Ref Range   Lipase 2,235 (H) 11 - 51 U/L    Comment: RESULTS CONFIRMED BY MANUAL DILUTION Performed  at [place] [address] [place], [place] [address]   Comprehensive metabolic panel     Status: Abnormal   Collection Time: 01/30/20 11:45 PM  Result Value Ref Range   Sodium 143 135 - 145 mmol/L   Potassium 4.0 3.5 - 5.1 mmol/L   Chloride 101 98 - 111 mmol/L   CO2 31 22 - 32 mmol/L   Glucose, Bld 109 (H) 70 - 99 mg/dL    Comment: Glucose reference range applies only to samples taken after fasting for at least 8 hours.   BUN 24 (H) 6 - 20 mg/dL   Creatinine, Ser 0.97 0.61 - 1.24 mg/dL   Calcium 9.8 8.9 - 10.3 mg/dL   Total Protein 6.6 6.5 - 8.1 g/dL   Albumin 4.2 3.5 - 5.0 g/dL   AST 106 (H) 15 - 41 U/L   ALT 59 (H) 0 - 44 U/L   Alkaline Phosphatase 102 38 - 126 U/L   Total Bilirubin 0.9 0.3 - 1.2 mg/dL   GFR calc non Af Amer >60 >60 mL/min   GFR calc Af Amer >60 >60 mL/min   Anion gap 11 5 - 15    Comment: Performed at [facility], [place] [address], [place], [place] [address]  CBC     Status: Abnormal   Collection Time: 01/30/20 11:45 PM  Result Value Ref Range   WBC 11.3 (H) 4.0 - 10.5 K/uL   RBC 5.25 4.22 - 5.81 MIL/uL   Hemoglobin 15.7 13.0 - 17.0 g/dL   HCT 48.5 39 - 52 %   MCV 92.4 80.0 - 100.0 fL   MCH 29.9 26.0 - 34.0 pg   MCHC 32.4 30.0 - 36.0 g/dL   RDW 14.6 11.5 - 15.5 %   Platelets 234 150 - 400 K/uL   nRBC 0.0 0.0 - 0.2 %    Comment: Performed at [facility], [place] [address], [place], [place] [address]  Troponin I (High Sensitivity)     Status: None   Collection Time: 01/31/20 12:25 AM  Result Value Ref Range   Troponin I (High Sensitivity) 3 <18 ng/L    Comment: (NOTE) Elevated high sensitivity troponin I (hsTnI) values and significant  changes across serial measurements may suggest ACS but many other  chronic and acute conditions are known to elevate hsTnI results.  Refer to the "Links" section for chest pain algorithms and additional  guidance. Performed at [facility],  [place] [address], [place], [place] [address]   Troponin I (High Sensitivity)     Status: None   Collection Time: 01/31/20  3:13 AM  Result Value Ref Range   Troponin I (High Sensitivity) 3 <18 ng/L    Comment: (NOTE) Elevated high sensitivity troponin I (hsTnI) values and significant  changes across serial measurements may suggest ACS but many other  chronic and acute conditions are known  to elevate hsTnI results.  Refer to the "Links" section for chest pain algorithms and additional  guidance. Performed at [facility], [place] [address] [place], [place] [address]   Triglycerides     Status: None   Collection Time: 01/31/20  3:13 AM  Result Value Ref Range   Triglycerides 52 <150 mg/dL    Comment: Performed at [facility], [place] [address] [place], [place] [address]  SARS Coronavirus 2 by RT PCR (hospital order, performed in [facility] hospital lab) Nasopharyngeal Nasopharyngeal Swab     Status: None   Collection Time: 01/31/20  4:19 AM   Specimen: Nasopharyngeal Swab  Result Value Ref Range   SARS Coronavirus 2 NEGATIVE NEGATIVE    Comment: (NOTE) SARS-CoV-2 target nucleic acids are NOT DETECTED.  The SARS-CoV-2 RNA is generally detectable in upper and lower respiratory specimens during the acute phase of infection. The lowest concentration of SARS-CoV-2 viral copies this assay can detect is 250 copies / mL. A negative result does not preclude SARS-CoV-2 infection and should not be used as the sole basis for treatment or other patient management decisions.  A negative result may occur with improper specimen collection / handling, submission of specimen other than nasopharyngeal swab, presence of viral mutation(s) within the areas targeted by this assay, and inadequate number of viral copies (<250 copies / mL). A negative result must be combined with clinical observations, patient history, and epidemiological information.  Fact Sheet for  Patients:   StrictlyIdeas.no  Fact Sheet for Healthcare Providers: BankingDealers.co.za  This test is not yet approved or  cleared by the [place] FDA and has been authorized for detection and/or diagnosis of SARS-CoV-2 by FDA under an Emergency Use Authorization (EUA).  This EUA will remain in effect (meaning this test can be used) for the duration of the COVID-19 declaration under Section 564(b)(1) of the Act, 21 U.S.C. section 360bbb-3(b)(1), unless the authorization is terminated or revoked sooner.  Performed at [facility], [place] [address] [place], [place] [address]   Basic metabolic panel     Status: Abnormal   Collection Time: 01/31/20  5:37 AM  Result Value Ref Range   Sodium 140 135 - 145 mmol/L   Potassium 3.9 3.5 - 5.1 mmol/L   Chloride 108 98 - 111 mmol/L   CO2 26 22 - 32 mmol/L   Glucose, Bld 120 (H) 70 - 99 mg/dL    Comment: Glucose reference range applies only to samples taken after fasting for at least 8 hours.   BUN 24 (H) 6 - 20 mg/dL   Creatinine, Ser 0.90 0.61 - 1.24 mg/dL   Calcium 8.7 (L) 8.9 - 10.3 mg/dL   GFR calc non Af Amer >60 >60 mL/min   GFR calc Af Amer >60 >60 mL/min   Anion gap 6 5 - 15    Comment: Performed at [facility], [place] [address] [place], [place] [address]  CBC with Differential/Platelet     Status: Abnormal   Collection Time: 01/31/20  5:37 AM  Result Value Ref Range   WBC 11.4 (H) 4.0 - 10.5 K/uL   RBC 4.41 4.22 - 5.81 MIL/uL   Hemoglobin 13.2 13.0 - 17.0 g/dL   HCT 41.0 39 - 52 %   MCV 93.0 80.0 - 100.0 fL   MCH 29.9 26.0 - 34.0 pg   MCHC 32.2 30.0 - 36.0 g/dL   RDW 14.8 11.5 - 15.5 %   Platelets 152 150 -  400 K/uL   nRBC 0.0 0.0 - 0.2 %   Neutrophils Relative % 91 %   Neutro Abs 10.5 (H) 1.7 - 7.7 K/uL   Lymphocytes Relative 3 %   Lymphs Abs 0.3 (L) 0.7 - 4.0 K/uL   Monocytes Relative 5 %   Monocytes Absolute 0.6 0 - 1 K/uL    Eosinophils Relative 0 %   Eosinophils Absolute 0.0 0 - 0 K/uL   Basophils Relative 0 %   Basophils Absolute 0.0 0 - 0 K/uL   Immature Granulocytes 1 %   Abs Immature Granulocytes 0.06 0.00 - 0.07 K/uL    Comment: Performed at [facility], [place] [address] [place], [place] [address]  Hepatic function panel     Status: Abnormal   Collection Time: 01/31/20  5:37 AM  Result Value Ref Range   Total Protein 5.3 (L) 6.5 - 8.1 g/dL   Albumin 3.4 (L) 3.5 - 5.0 g/dL   AST 226 (H) 15 - 41 U/L   ALT 197 (H) 0 - 44 U/L   Alkaline Phosphatase 100 38 - 126 U/L   Total Bilirubin 0.9 0.3 - 1.2 mg/dL   Bilirubin, Direct 0.3 (H) 0.0 - 0.2 mg/dL   Indirect Bilirubin 0.6 0.3 - 0.9 mg/dL    Comment: Performed at [facility], [place] [address] [place], [place] [address]  Urinalysis, Routine w reflex microscopic     Status: None   Collection Time: 01/31/20  6:05 AM  Result Value Ref Range   Color, Urine YELLOW YELLOW   APPearance CLEAR CLEAR   Specific Gravity, Urine 1.024 1.005 - 1.030   pH 7.0 5.0 - 8.0   Glucose, UA NEGATIVE NEGATIVE mg/dL   Hgb urine dipstick NEGATIVE NEGATIVE   Bilirubin Urine NEGATIVE NEGATIVE   Ketones, ur NEGATIVE NEGATIVE mg/dL   Protein, ur NEGATIVE NEGATIVE mg/dL   Nitrite NEGATIVE NEGATIVE   Leukocytes,Ua NEGATIVE NEGATIVE    Comment: Performed at [place] [address] [place], [place] [address]  CBG monitoring, ED     Status: Abnormal   Collection Time: 01/31/20  7:39 AM  Result Value Ref Range   Glucose-Capillary 117 (H) 70 - 99 mg/dL    Comment: Glucose reference range applies only to samples taken after fasting for at least 8 hours.  Glucose, capillary     Status: Abnormal   Collection Time: 01/31/20 11:36 PM  Result Value Ref Range   Glucose-Capillary 69 (L) 70 - 99 mg/dL    Comment: Glucose reference range applies only to samples taken after fasting for at least 8 hours.  Comprehensive metabolic  panel     Status: Abnormal   Collection Time: 02/01/20  5:22 AM  Result Value Ref Range   Sodium 136 135 - 145 mmol/L   Potassium 3.9 3.5 - 5.1 mmol/L   Chloride 104 98 - 111 mmol/L   CO2 26 22 - 32 mmol/L   Glucose, Bld 76 70 - 99 mg/dL    Comment: Glucose reference range applies only to samples taken after fasting for at least 8 hours.   BUN 20 6 - 20 mg/dL   Creatinine, Ser 0.96 0.61 - 1.24 mg/dL   Calcium 8.4 (L) 8.9 - 10.3 mg/dL   Total Protein 5.3 (L) 6.5 - 8.1 g/dL   Albumin 3.1 (L) 3.5 - 5.0 g/dL   AST 86 (H) 15 - 41 U/L   ALT 162 (H) 0 - 44 U/L   Alkaline Phosphatase  85 38 - 126 U/L   Total Bilirubin 1.1 0.3 - 1.2 mg/dL   GFR calc non Af Amer >60 >60 mL/min   GFR calc Af Amer >60 >60 mL/min   Anion gap 6 5 - 15    Comment: Performed at [facility], [place] [address], [place], [place] [address]  CBC with Differential/Platelet     Status: Abnormal   Collection Time: 02/01/20  5:22 AM  Result Value Ref Range   WBC 8.0 4.0 - 10.5 K/uL   RBC 4.08 (L) 4.22 - 5.81 MIL/uL   Hemoglobin 12.1 (L) 13.0 - 17.0 g/dL   HCT 38.4 (L) 39 - 52 %   MCV 94.1 80.0 - 100.0 fL   MCH 29.7 26.0 - 34.0 pg   MCHC 31.5 30.0 - 36.0 g/dL   RDW 15.3 11.5 - 15.5 %   Platelets 118 (L) 150 - 400 K/uL    Comment: REPEATED TO VERIFY PLATELET COUNT CONFIRMED BY SMEAR SPECIMEN CHECKED FOR CLOTS Immature Platelet Fraction may be clinically indicated, consider ordering this additional test [id number]    nRBC 0.0 0.0 - 0.2 %   Neutrophils Relative % 76 %   Neutro Abs 6.1 1.7 - 7.7 K/uL   Lymphocytes Relative 15 %   Lymphs Abs 1.2 0.7 - 4.0 K/uL   Monocytes Relative 8 %   Monocytes Absolute 0.6 0 - 1 K/uL   Eosinophils Relative 1 %   Eosinophils Absolute 0.1 0 - 0 K/uL   Basophils Relative 0 %   Basophils Absolute 0.0 0 - 0 K/uL   Immature Granulocytes 0 %   Abs Immature Granulocytes 0.01 0.00 - 0.07 K/uL    Comment: Performed at [facility], [place] [address] [place], [place] [address]  Protime-INR     Status: Abnormal   Collection Time: 02/01/20  5:22 AM  Result Value Ref Range   Prothrombin Time 15.8 (H) 11.4 - 15.2 seconds   INR 1.3 (H) 0.8 - 1.2    Comment: (NOTE) INR goal varies based on device and disease states. Performed at [facility], [place] [address] [place], [place] [address]   Lipase, blood     Status: Abnormal   Collection Time: 02/01/20  5:22 AM  Result Value Ref Range   Lipase 1,033 (H) 11 - 51 U/L    Comment: RESULTS CONFIRMED BY MANUAL DILUTION Performed at [facility], [place] [address] [place], [place] [address]   Glucose, capillary     Status: Abnormal   Collection Time: 02/01/20  7:12 AM  Result Value Ref Range   Glucose-Capillary 68 (L) 70 - 99 mg/dL    Comment: Glucose reference range applies only to samples taken after fasting for at least 8 hours.   US Abdomen Complete  Result Date: 01/31/2020 CLINICAL DATA:  Upper abdominal pain EXAM: ABDOMEN ULTRASOUND COMPLETE COMPARISON:  CT 01/31/2020 FINDINGS: Gallbladder: Small amount of sludge with small stones in the gallbladder. Normal wall thickness. Negative sonographic Murphy. Common bile duct: Diameter: 3 mm Liver: Borderline to slightly enlarged. Within normal limits for echogenicity. Portal vein is patent on color Doppler imaging with normal direction of blood flow towards the liver. IVC: No abnormality visualized. Pancreas: Limited evaluation due to bowel gas. Spleen: Size and appearance within normal limits. Right Kidney: Length: 12.3 cm. Echogenicity within normal limits. No mass or hydronephrosis visualized. Left Kidney: Length: 13.3 cm. Echogenicity within normal limits. No mass or hydronephrosis visualized. Abdominal aorta: No aneurysm  visualized. Other findings: None. IMPRESSION: 1. Small amount of sludge in the gallbladder with small stones. Negative for acute cholecystitis. 2. Borderline to slightly enlarged liver.  Electronically Signed   By: Donavan Foil M.D.   On: 01/31/2020 19:27   CT ABDOMEN PELVIS W CONTRAST  Result Date: 01/31/2020 CLINICAL DATA:  Sudden onset upper abdominal pain EXAM: CT ABDOMEN AND PELVIS WITH CONTRAST TECHNIQUE: Multidetector CT imaging of the abdomen and pelvis was performed using the standard protocol following bolus administration of intravenous contrast. CONTRAST:  148mL OMNIPAQUE IOHEXOL 300 MG/ML  SOLN COMPARISON:  None. FINDINGS: Lower chest: No acute pleural or parenchymal lung disease. Hepatobiliary: No focal liver abnormality is seen. No gallstones, gallbladder wall thickening, or biliary dilatation. Pancreas: There is fat stranding surrounding the head of the pancreas consistent with acute uncomplicated pancreatitis. The pancreatic parenchyma enhances normally. Spleen: Normal in size without focal abnormality. Adrenals/Urinary Tract: There are bilateral nonobstructing renal calculi, largest on the right measuring 7 mm and on the left measuring 9 mm. No obstructive uropathy within either kidney. The ureters and bladder are grossly normal. Adrenals are unremarkable. Stomach/Bowel: No bowel obstruction or ileus. Normal appendix right lower quadrant. Postsurgical changes from prior sigmoid colon resection and reanastomosis. No bowel wall thickening. Fat stranding surrounding the duodenum likely related to the pancreatitis described above. Vascular/Lymphatic: There is minimal atherosclerosis of the distal aorta. No pathologic adenopathy within the abdomen or pelvis. Reproductive: Prostate is unremarkable. Other: Trace free fluid in the right upper quadrant. No free intraperitoneal gas. No fluid collection, pseudocyst, or abscess. Musculoskeletal: No acute or destructive bony lesions. Reconstructed images demonstrate no additional findings. IMPRESSION: 1. Mild inflammatory changes surrounding the head of the pancreas, consistent with acute uncomplicated pancreatitis. 2. Bilateral  nonobstructing renal calculi. Electronically Signed   By: Randa Ngo M.D.   On: 01/31/2020 03:13      Assessment/Plan HTN GERD  Gallstone pancreatitis - US yesterday showed cholelithiasis, CBD diameter 3 mm - CT yesterday with mild inflammatory changes surrounding head of the pancreas - Alk Phos, AST/ALT all improving - lipase trending down but still 1033 from 2235 on admit - not ready for OR today with lipase that high, will follow for surgical readiness - repeat labs in AM and make NPO after midnight   FEN: FLD, IVF VTE: lovenox ID: no current abx  Norm Parcel, [facility] Surgery 02/01/2020, 8:31 AM Please see Amion for pager number during day hours 7:00am-4:30pm

## 2020-02-01 NOTE — Progress Notes (Signed)
PROGRESS NOTE    Andrew Hooper  WSF:[id number] DOB: 1966-01-02 DOA: 01/30/2020 PCP: Deland Pretty, MD  Brief Narrative:  Essentially 54 year old white male BMI 29 OSA on CPAP AHI 67.8 with successful 45 pound weight loss recently HTN Depression on Lexapro Previously on testosterone replacement?  Under care of the [facility] clinic History of ascending aortic aneurysm 4.5 to 4.4 cm   premature coronary disease followed by Dr. Debara Pickett for lipid clinic  Admitted for pancreatitis secondary to gallstone pancreatitis Lipase was 2000 CT showed no specific necrosis  GI Dr. Benson [place] consulted, general surgery consulted Patient going for lap chole possibly 9/1  Assessment & Plan:   Principal Problem:   Acute pancreatitis Active Problems:   Essential hypertension   1. Gallstone pancreatitis a. Ultrasound performed 8/30 shows sludge b. General surgery consulted for lap chole and are waiting until lipase goes below 100 c. Continue saline 150 cc/H d. Diet as tolerated per general surgery e. May shower 2. BMI 29 3. Prior testosterone replacement a. Patient congratulated on weight loss and hard work b. Long discussion today about testosterone, sources of how to obtain the same in addition to counseling regarding whether this should be used or not-he will follow up in the outpatient setting with his primary to discuss this further 4. OSA on CPAP a. We will allow AutoPap at night b. Stable 5. HTN 6. History premature coronary artery disease a. May use labetalol 10 mg 2 hourly as needed for blood pressure above 160  DVT prophylaxis: Lovenox Code Status: Full Family Communication: Wife not present today updated yesterday Disposition:   Status is: Inpatient  Remains inpatient appropriate because:Persistent severe electrolyte disturbances, Unsafe d/c plan and IV treatments appropriate due to intensity of illness or inability to take PO   Dispo:  Patient From: Home  Planned  Disposition: Home  Expected discharge date: 02/02/20  Medically stable for discharge: No        Consultants:   General surgery  GI  Procedures: CT and ultrasound  Antimicrobials: None   Subjective: Awake alert coherent pleasant no pain moving around the room Asking to shower No point nausea vomiting no chest pain  Objective: Vitals:   02/01/20 0155 02/01/20 0200 02/01/20 0553 02/01/20 1019  BP: 94/65 98/66 (!) 94/54 98/62  Pulse: 82  79 76  Resp: 14  15 16   Temp: 98.6 F (37 C)  99 F (37.2 C) 97.8 F (36.6 C)  TempSrc: Oral     SpO2: 95%  94% 95%  Weight:      Height:        Intake/Output Summary (Last 24 hours) at 02/01/2020 1117 Last data filed at 02/01/2020 0945 Gross per 24 hour  Intake 5419.66 ml  Output --  Net 5419.66 ml   Filed Weights   01/30/20 2325  Weight: 97.5 kg    Examination:  General exam: EOMI NCAT no focal deficit no icterus no pallor Respiratory system: Clear no added sound no rales no rhonchi Cardiovascular system: S1-S2 no murmur Gastrointestinal system: Soft nontender obese. Central nervous system: ROM intact moving all 4 limbs equally power 5/5 smile symmetric Extremities: No lower extremity edema Skin: No rash Psychiatry: Euthymic you congruent  Data Reviewed: I have personally reviewed following labs and imaging studies  BUN/creatinine 20/0.9 Lipase down to 1033 AST/ALT 86/162 Hemoglobin 12.1 Platelet 118  Radiology Studies: US Abdomen Complete  Result Date: 01/31/2020 CLINICAL DATA:  Upper abdominal pain EXAM: ABDOMEN ULTRASOUND COMPLETE COMPARISON:  CT 01/31/2020 FINDINGS: Gallbladder: Small amount  of sludge with small stones in the gallbladder. Normal wall thickness. Negative sonographic Murphy. Common bile duct: Diameter: 3 mm Liver: Borderline to slightly enlarged. Within normal limits for echogenicity. Portal vein is patent on color Doppler imaging with normal direction of blood flow towards the liver. IVC: No  abnormality visualized. Pancreas: Limited evaluation due to bowel gas. Spleen: Size and appearance within normal limits. Right Kidney: Length: 12.3 cm. Echogenicity within normal limits. No mass or hydronephrosis visualized. Left Kidney: Length: 13.3 cm. Echogenicity within normal limits. No mass or hydronephrosis visualized. Abdominal aorta: No aneurysm visualized. Other findings: None. IMPRESSION: 1. Small amount of sludge in the gallbladder with small stones. Negative for acute cholecystitis. 2. Borderline to slightly enlarged liver. Electronically Signed   By: Donavan Foil M.D.   On: 01/31/2020 19:27   CT ABDOMEN PELVIS W CONTRAST  Result Date: 01/31/2020 CLINICAL DATA:  Sudden onset upper abdominal pain EXAM: CT ABDOMEN AND PELVIS WITH CONTRAST TECHNIQUE: Multidetector CT imaging of the abdomen and pelvis was performed using the standard protocol following bolus administration of intravenous contrast. CONTRAST:  174mL OMNIPAQUE IOHEXOL 300 MG/ML  SOLN COMPARISON:  None. FINDINGS: Lower chest: No acute pleural or parenchymal lung disease. Hepatobiliary: No focal liver abnormality is seen. No gallstones, gallbladder wall thickening, or biliary dilatation. Pancreas: There is fat stranding surrounding the head of the pancreas consistent with acute uncomplicated pancreatitis. The pancreatic parenchyma enhances normally. Spleen: Normal in size without focal abnormality. Adrenals/Urinary Tract: There are bilateral nonobstructing renal calculi, largest on the right measuring 7 mm and on the left measuring 9 mm. No obstructive uropathy within either kidney. The ureters and bladder are grossly normal. Adrenals are unremarkable. Stomach/Bowel: No bowel obstruction or ileus. Normal appendix right lower quadrant. Postsurgical changes from prior sigmoid colon resection and reanastomosis. No bowel wall thickening. Fat stranding surrounding the duodenum likely related to the pancreatitis described above.  Vascular/Lymphatic: There is minimal atherosclerosis of the distal aorta. No pathologic adenopathy within the abdomen or pelvis. Reproductive: Prostate is unremarkable. Other: Trace free fluid in the right upper quadrant. No free intraperitoneal gas. No fluid collection, pseudocyst, or abscess. Musculoskeletal: No acute or destructive bony lesions. Reconstructed images demonstrate no additional findings. IMPRESSION: 1. Mild inflammatory changes surrounding the head of the pancreas, consistent with acute uncomplicated pancreatitis. 2. Bilateral nonobstructing renal calculi. Electronically Signed   By: Randa Ngo M.D.   On: 01/31/2020 03:13     Scheduled Meds: . heparin  5,000 Units Subcutaneous Q8H   Continuous Infusions: . lactated ringers 150 mL/hr at 02/01/20 0851     LOS: 1 day    Time spent: 25 minutes  Nita Sells, MD [facility] To contact the attending provider between 7A-7P or the covering provider during after hours 7P-7A, please log into the web site [email or web address] and access using universal [place] password for that web site. If you do not have the password, please call the hospital operator.  02/01/2020, 11:17 AM

## 2020-02-01 NOTE — Plan of Care (Signed)
Pt VS WNL this am.  No complaints at this time.   Problem: Education: Goal: Knowledge of General Education information will improve Description: Including pain rating scale, medication(s)/side effects and non-pharmacologic comfort measures Outcome: Progressing   Problem: Health Behavior/Discharge Planning: Goal: Ability to manage health-related needs will improve Outcome: Progressing   Problem: Clinical Measurements: Goal: Ability to maintain clinical measurements within normal limits will improve Outcome: Progressing Goal: Will remain free from infection Outcome: Progressing Goal: Diagnostic test results will improve Outcome: Progressing Goal: Respiratory complications will improve Outcome: Progressing Goal: Cardiovascular complication will be avoided Outcome: Progressing   Problem: Activity: Goal: Risk for activity intolerance will decrease Outcome: Progressing   Problem: Nutrition: Goal: Adequate nutrition will be maintained Outcome: Progressing   Problem: Coping: Goal: Level of anxiety will decrease Outcome: Progressing   Problem: Elimination: Goal: Will not experience complications related to bowel motility Outcome: Progressing Goal: Will not experience complications related to urinary retention Outcome: Progressing   Problem: Pain Managment: Goal: General experience of comfort will improve Outcome: Progressing   Problem: Safety: Goal: Ability to remain free from injury will improve Outcome: Progressing   Problem: Skin Integrity: Goal: Risk for impaired skin integrity will decrease Outcome: Progressing   

## 2020-02-01 NOTE — Progress Notes (Signed)
Subjective: Feeling better.  Abdomen is still sore, but improved.  Objective: Vital signs in last 24 hours: Temp:  [98.2 F (36.8 C)-99.9 F (37.7 C)] 99 F (37.2 C) (08/31 0553) Pulse Rate:  [79-103] 79 (08/31 0553) Resp:  [14-16] 15 (08/31 0553) BP: (83-110)/(54-70) 94/54 (08/31 0553) SpO2:  [94 %-98 %] 94 % (08/31 0553) Last BM Date: 01/31/20  Intake/Output from previous day: 08/30 0701 - 08/31 0700 In: 7475.2 [P.O.:240; I.V.:5235.2; IV Piggyback:2000] Out: -  Intake/Output this shift: Total I/O In: 3215.7 [P.O.:240; I.V.:2975.7] Out: -   General appearance: alert and no distress GI: Tender in the epigastric region, no rebound or rigidity.  Lab Results: Recent Labs    01/30/20 2345 01/31/20 0537 02/01/20 0522  WBC 11.3* 11.4* 8.0  HGB 15.7 13.2 12.1*  HCT 48.5 41.0 38.4*  PLT 234 152 118*   BMET Recent Labs    01/30/20 2345 01/31/20 0537 02/01/20 0522  NA 143 140 136  K 4.0 3.9 3.9  CL 101 108 104  CO2 31 26 26   GLUCOSE 109* 120* 76  BUN 24* 24* 20  CREATININE 0.97 0.90 0.96  CALCIUM 9.8 8.7* 8.4*   LFT Recent Labs    01/31/20 0537 01/31/20 0537 02/01/20 0522  PROT 5.3*   < > 5.3*  ALBUMIN 3.4*   < > 3.1*  AST 226*   < > 86*  ALT 197*   < > 162*  ALKPHOS 100   < > 85  BILITOT 0.9   < > 1.1  BILIDIR 0.3*  --   --   IBILI 0.6  --   --    < > = values in this interval not displayed.   PT/INR Recent Labs    02/01/20 0522  LABPROT 15.8*  INR 1.3*   Hepatitis Panel No results for input(s): HEPBSAG, HCVAB, HEPAIGM, HEPBIGM in the last 72 hours. C-Diff No results for input(s): CDIFFTOX in the last 72 hours. Fecal Lactopherrin No results for input(s): FECLLACTOFRN in the last 72 hours.  Studies/Results: US Abdomen Complete  Result Date: 01/31/2020 CLINICAL DATA:  Upper abdominal pain EXAM: ABDOMEN ULTRASOUND COMPLETE COMPARISON:  CT 01/31/2020 FINDINGS: Gallbladder: Small amount of sludge with small stones in the gallbladder. Normal wall  thickness. Negative sonographic Murphy. Common bile duct: Diameter: 3 mm Liver: Borderline to slightly enlarged. Within normal limits for echogenicity. Portal vein is patent on color Doppler imaging with normal direction of blood flow towards the liver. IVC: No abnormality visualized. Pancreas: Limited evaluation due to bowel gas. Spleen: Size and appearance within normal limits. Right Kidney: Length: 12.3 cm. Echogenicity within normal limits. No mass or hydronephrosis visualized. Left Kidney: Length: 13.3 cm. Echogenicity within normal limits. No mass or hydronephrosis visualized. Abdominal aorta: No aneurysm visualized. Other findings: None. IMPRESSION: 1. Small amount of sludge in the gallbladder with small stones. Negative for acute cholecystitis. 2. Borderline to slightly enlarged liver. Electronically Signed   By: Donavan Foil M.D.   On: 01/31/2020 19:27   CT ABDOMEN PELVIS W CONTRAST  Result Date: 01/31/2020 CLINICAL DATA:  Sudden onset upper abdominal pain EXAM: CT ABDOMEN AND PELVIS WITH CONTRAST TECHNIQUE: Multidetector CT imaging of the abdomen and pelvis was performed using the standard protocol following bolus administration of intravenous contrast. CONTRAST:  155mL OMNIPAQUE IOHEXOL 300 MG/ML  SOLN COMPARISON:  None. FINDINGS: Lower chest: No acute pleural or parenchymal lung disease. Hepatobiliary: No focal liver abnormality is seen. No gallstones, gallbladder wall thickening, or biliary dilatation. Pancreas: There is fat  stranding surrounding the head of the pancreas consistent with acute uncomplicated pancreatitis. The pancreatic parenchyma enhances normally. Spleen: Normal in size without focal abnormality. Adrenals/Urinary Tract: There are bilateral nonobstructing renal calculi, largest on the right measuring 7 mm and on the left measuring 9 mm. No obstructive uropathy within either kidney. The ureters and bladder are grossly normal. Adrenals are unremarkable. Stomach/Bowel: No bowel  obstruction or ileus. Normal appendix right lower quadrant. Postsurgical changes from prior sigmoid colon resection and reanastomosis. No bowel wall thickening. Fat stranding surrounding the duodenum likely related to the pancreatitis described above. Vascular/Lymphatic: There is minimal atherosclerosis of the distal aorta. No pathologic adenopathy within the abdomen or pelvis. Reproductive: Prostate is unremarkable. Other: Trace free fluid in the right upper quadrant. No free intraperitoneal gas. No fluid collection, pseudocyst, or abscess. Musculoskeletal: No acute or destructive bony lesions. Reconstructed images demonstrate no additional findings. IMPRESSION: 1. Mild inflammatory changes surrounding the head of the pancreas, consistent with acute uncomplicated pancreatitis. 2. Bilateral nonobstructing renal calculi. Electronically Signed   By: Randa Ngo M.D.   On: 01/31/2020 03:13    Medications:  Scheduled: . heparin  5,000 Units Subcutaneous Q8H   Continuous:   Assessment/Plan: 1) Gallstone pancreatitis. 2) Abdominal pain - improving. 3) Abnormal liver enzymes - improving.   The patient's liver enzymes are trending down and clinically he feels better.  He is urinating well and his electrolytes are stable on LR.  BUN and HCT trending appropriately downwards.  The patient is not tachycardic.  The ultrasound was positive for small stones and sludge in the gallbladder and the CBD was measured to be 3 mm.  There was no evidence of intrahepatic ductal dilation.  Plan: 1) Surgical consultation for lap chole. 2) Continue with IV hydration, but it can be dropped down today to 150 ml as he responded very well to hydration. 3) Signing off at this time as there is no further need for GI involvement.  LOS: 1 day   Andrew Hooper D 02/01/2020, 6:53 AM

## 2020-02-02 ENCOUNTER — Inpatient Hospital Stay (HOSPITAL_COMMUNITY): Payer: 59 | Admitting: Certified Registered"

## 2020-02-02 ENCOUNTER — Inpatient Hospital Stay (HOSPITAL_COMMUNITY): Payer: 59

## 2020-02-02 ENCOUNTER — Encounter (HOSPITAL_COMMUNITY): Payer: Self-pay | Admitting: Internal Medicine

## 2020-02-02 ENCOUNTER — Encounter (HOSPITAL_COMMUNITY)
Admission: EM | Disposition: A | Discharge status: Home / Self Care | Payer: Self-pay | Source: Home / Self Care | Attending: Family Medicine

## 2020-02-02 DIAGNOSIS — G4733 Obstructive sleep apnea (adult) (pediatric): Secondary | ICD-10-CM

## 2020-02-02 DIAGNOSIS — K851 Biliary acute pancreatitis without necrosis or infection: Principal | ICD-10-CM

## 2020-02-02 DIAGNOSIS — R748 Abnormal levels of other serum enzymes: Secondary | ICD-10-CM

## 2020-02-02 DIAGNOSIS — Z9989 Dependence on other enabling machines and devices: Secondary | ICD-10-CM

## 2020-02-02 HISTORY — PX: CHOLECYSTECTOMY: SHX55

## 2020-02-02 LAB — COMPREHENSIVE METABOLIC PANEL
ALT: 113 U/L — ABNORMAL HIGH (ref 0–44)
AST: 46 U/L — ABNORMAL HIGH (ref 15–41)
Albumin: 3.1 g/dL — ABNORMAL LOW (ref 3.5–5.0)
Alkaline Phosphatase: 88 U/L (ref 38–126)
Anion gap: 8 (ref 5–15)
BUN: 10 mg/dL (ref 6–20)
CO2: 24 mmol/L (ref 22–32)
Calcium: 8.5 mg/dL — ABNORMAL LOW (ref 8.9–10.3)
Chloride: 106 mmol/L (ref 98–111)
Creatinine, Ser: 0.97 mg/dL (ref 0.61–1.24)
GFR calc Af Amer: >60 mL/min (ref >60)
GFR calc non Af Amer: >60 mL/min (ref >60)
Glucose, Bld: 81 mg/dL (ref 70–99)
Potassium: 4 mmol/L (ref 3.5–5.1)
Sodium: 138 mmol/L (ref 135–145)
Total Bilirubin: 0.9 mg/dL (ref 0.3–1.2)
Total Protein: 5.5 g/dL — ABNORMAL LOW (ref 6.5–8.1)

## 2020-02-02 LAB — CBC WITH DIFFERENTIAL/PLATELET
Abs Immature Granulocytes: 0.02 10*3/uL (ref 0.00–0.07)
Basophils Absolute: 0 10*3/uL (ref 0.0–0.1)
Basophils Relative: 0 %
Eosinophils Absolute: 0.1 10*3/uL (ref 0.0–0.5)
Eosinophils Relative: 2 %
HCT: 39.2 % (ref 39.0–52.0)
Hemoglobin: 12.4 g/dL — ABNORMAL LOW (ref 13.0–17.0)
Immature Granulocytes: 0 %
Lymphocytes Relative: 23 %
Lymphs Abs: 1.4 10*3/uL (ref 0.7–4.0)
MCH: 29.8 pg (ref 26.0–34.0)
MCHC: 31.6 g/dL (ref 30.0–36.0)
MCV: 94.2 fL (ref 80.0–100.0)
Monocytes Absolute: 0.6 10*3/uL (ref 0.1–1.0)
Monocytes Relative: 9 %
Neutro Abs: 4 10*3/uL (ref 1.7–7.7)
Neutrophils Relative %: 66 %
Platelets: 130 10*3/uL — ABNORMAL LOW (ref 150–400)
RBC: 4.16 MIL/uL — ABNORMAL LOW (ref 4.22–5.81)
RDW: 14.9 % (ref 11.5–15.5)
WBC: 6.1 10*3/uL (ref 4.0–10.5)
nRBC: 0 % (ref 0.0–0.2)

## 2020-02-02 LAB — GLUCOSE, CAPILLARY
Glucose-Capillary: 137 mg/dL — ABNORMAL HIGH (ref 70–99)
Glucose-Capillary: 144 mg/dL — ABNORMAL HIGH (ref 70–99)
Glucose-Capillary: 75 mg/dL (ref 70–99)
Glucose-Capillary: 76 mg/dL (ref 70–99)

## 2020-02-02 LAB — LIPASE, BLOOD: Lipase: 173 U/L — ABNORMAL HIGH (ref 11–51)

## 2020-02-02 LAB — PROTIME-INR
INR: 1.1 (ref 0.8–1.2)
Prothrombin Time: 14.2 seconds (ref 11.4–15.2)

## 2020-02-02 SURGERY — LAPAROSCOPIC CHOLECYSTECTOMY WITH INTRAOPERATIVE CHOLANGIOGRAM
Anesthesia: General

## 2020-02-02 MED ORDER — SUGAMMADEX SODIUM 200 MG/2ML IV SOLN
INTRAVENOUS | Status: DC | PRN
  Administered 2020-02-02: 200 mg via INTRAVENOUS

## 2020-02-02 MED ORDER — GABAPENTIN 300 MG PO CAPS
300.0000 mg | ORAL_CAPSULE | ORAL | Status: AC
  Administered 2020-02-02: 300 mg via ORAL
  Filled 2020-02-02: qty 1

## 2020-02-02 MED ORDER — PROPOFOL 10 MG/ML IV BOLUS
INTRAVENOUS | Status: DC | PRN
  Administered 2020-02-02: 150 mg via INTRAVENOUS

## 2020-02-02 MED ORDER — BUPIVACAINE-EPINEPHRINE 0.25% -1:200000 IJ SOLN
INTRAMUSCULAR | Status: DC | PRN
  Administered 2020-02-02: 30 mL

## 2020-02-02 MED ORDER — FENTANYL CITRATE (PF) 100 MCG/2ML IJ SOLN
INTRAMUSCULAR | Status: DC | PRN
  Administered 2020-02-02 (×3): 50 ug via INTRAVENOUS

## 2020-02-02 MED ORDER — CEFAZOLIN SODIUM-DEXTROSE 2-3 GM-%(50ML) IV SOLR
INTRAVENOUS | Status: DC | PRN
  Administered 2020-02-02: 2 g via INTRAVENOUS

## 2020-02-02 MED ORDER — LIDOCAINE 2% (20 MG/ML) 5 ML SYRINGE
INTRAMUSCULAR | Status: DC | PRN
  Administered 2020-02-02: 100 mg via INTRAVENOUS

## 2020-02-02 MED ORDER — DEXAMETHASONE SODIUM PHOSPHATE 10 MG/ML IJ SOLN
INTRAMUSCULAR | Status: AC
  Filled 2020-02-02: qty 1

## 2020-02-02 MED ORDER — ONDANSETRON HCL 4 MG/2ML IJ SOLN: 4.0000 mg | Freq: Once | INTRAMUSCULAR | Status: DC | PRN

## 2020-02-02 MED ORDER — MORPHINE SULFATE (PF) 4 MG/ML IV SOLN: 1.0000 mg | INTRAVENOUS | Status: DC | PRN

## 2020-02-02 MED ORDER — OXYCODONE HCL 5 MG/5ML PO SOLN: 5.0000 mg | Freq: Once | ORAL | Status: DC | PRN

## 2020-02-02 MED ORDER — ROCURONIUM BROMIDE 10 MG/ML (PF) SYRINGE
PREFILLED_SYRINGE | INTRAVENOUS | Status: DC | PRN
  Administered 2020-02-02: 10 mg via INTRAVENOUS
  Administered 2020-02-02: 50 mg via INTRAVENOUS

## 2020-02-02 MED ORDER — IBUPROFEN 400 MG PO TABS: 600.0000 mg | ORAL_TABLET | Freq: Four times a day (QID) | ORAL | Status: DC | PRN

## 2020-02-02 MED ORDER — TRAMADOL HCL 50 MG PO TABS: 50.0000 mg | ORAL_TABLET | Freq: Four times a day (QID) | ORAL | Status: DC | PRN

## 2020-02-02 MED ORDER — OXYCODONE HCL 5 MG PO TABS: 5.0000 mg | ORAL_TABLET | Freq: Once | ORAL | Status: DC | PRN

## 2020-02-02 MED ORDER — KETOROLAC TROMETHAMINE 30 MG/ML IJ SOLN
INTRAMUSCULAR | Status: AC
  Filled 2020-02-02: qty 1

## 2020-02-02 MED ORDER — AMISULPRIDE (ANTIEMETIC) 5 MG/2ML IV SOLN: 10.0000 mg | Freq: Once | INTRAVENOUS | Status: DC | PRN

## 2020-02-02 MED ORDER — 0.9 % SODIUM CHLORIDE (POUR BTL) OPTIME
TOPICAL | Status: DC | PRN
  Administered 2020-02-02: 1000 mL

## 2020-02-02 MED ORDER — KETOROLAC TROMETHAMINE 30 MG/ML IJ SOLN
30.0000 mg | Freq: Once | INTRAMUSCULAR | Status: AC | PRN
  Administered 2020-02-02: 30 mg via INTRAVENOUS

## 2020-02-02 MED ORDER — DEXAMETHASONE SODIUM PHOSPHATE 10 MG/ML IJ SOLN
INTRAMUSCULAR | Status: DC | PRN
  Administered 2020-02-02: 8 mg via INTRAVENOUS

## 2020-02-02 MED ORDER — HYDROMORPHONE HCL 1 MG/ML IJ SOLN
0.2500 mg | INTRAMUSCULAR | Status: DC | PRN
  Administered 2020-02-02: 0.5 mg via INTRAVENOUS

## 2020-02-02 MED ORDER — LACTATED RINGERS IV SOLN: INTRAVENOUS | Status: DC

## 2020-02-02 MED ORDER — METHOCARBAMOL 500 MG PO TABS: 500.0000 mg | ORAL_TABLET | Freq: Four times a day (QID) | ORAL | Status: DC | PRN

## 2020-02-02 MED ORDER — PROPOFOL 10 MG/ML IV BOLUS
INTRAVENOUS | Status: AC
  Filled 2020-02-02: qty 20

## 2020-02-02 MED ORDER — SCOPOLAMINE 1 MG/3DAYS TD PT72
1.0000 | MEDICATED_PATCH | TRANSDERMAL | Status: DC
  Administered 2020-02-02: 1.5 mg via TRANSDERMAL
  Filled 2020-02-02 (×2): qty 1

## 2020-02-02 MED ORDER — ACETAMINOPHEN 500 MG PO TABS
1000.0000 mg | ORAL_TABLET | Freq: Four times a day (QID) | ORAL | Status: DC
  Administered 2020-02-02 – 2020-02-03 (×4): 1000 mg via ORAL
  Filled 2020-02-02 (×4): qty 2

## 2020-02-02 MED ORDER — FENTANYL CITRATE (PF) 100 MCG/2ML IJ SOLN
INTRAMUSCULAR | Status: AC
  Filled 2020-02-02: qty 2

## 2020-02-02 MED ORDER — GABAPENTIN 300 MG PO CAPS
300.0000 mg | ORAL_CAPSULE | Freq: Two times a day (BID) | ORAL | Status: DC
  Administered 2020-02-02 – 2020-02-03 (×3): 300 mg via ORAL
  Filled 2020-02-02 (×3): qty 1

## 2020-02-02 MED ORDER — ACETAMINOPHEN 500 MG PO TABS
1000.0000 mg | ORAL_TABLET | ORAL | Status: AC
  Administered 2020-02-02: 1000 mg via ORAL
  Filled 2020-02-02: qty 2

## 2020-02-02 MED ORDER — ONDANSETRON HCL 4 MG/2ML IJ SOLN
INTRAMUSCULAR | Status: AC
  Filled 2020-02-02: qty 2

## 2020-02-02 MED ORDER — MIDAZOLAM HCL 2 MG/2ML IJ SOLN
INTRAMUSCULAR | Status: AC
  Filled 2020-02-02: qty 2

## 2020-02-02 MED ORDER — CEFAZOLIN SODIUM-DEXTROSE 2-4 GM/100ML-% IV SOLN
2.0000 g | Freq: Once | INTRAVENOUS | Status: DC
  Filled 2020-02-02: qty 100

## 2020-02-02 MED ORDER — HYDROMORPHONE HCL 1 MG/ML IJ SOLN
INTRAMUSCULAR | Status: AC
  Filled 2020-02-02: qty 1

## 2020-02-02 MED ORDER — SODIUM CHLORIDE 0.9 % IV SOLN
INTRAVENOUS | Status: DC | PRN
  Administered 2020-02-02: 7 mL

## 2020-02-02 MED ORDER — LACTATED RINGERS IR SOLN
Status: DC | PRN
  Administered 2020-02-02: 1000 mL

## 2020-02-02 MED ORDER — ROCURONIUM BROMIDE 10 MG/ML (PF) SYRINGE
PREFILLED_SYRINGE | INTRAVENOUS | Status: AC
  Filled 2020-02-02: qty 10

## 2020-02-02 MED ORDER — KETOROLAC TROMETHAMINE 15 MG/ML IJ SOLN: 15.0000 mg | INTRAMUSCULAR | Status: DC

## 2020-02-02 MED ORDER — EPHEDRINE 5 MG/ML INJ
INTRAVENOUS | Status: AC
  Filled 2020-02-02: qty 10

## 2020-02-02 MED ORDER — ONDANSETRON HCL 4 MG/2ML IJ SOLN
INTRAMUSCULAR | Status: DC | PRN
  Administered 2020-02-02: 4 mg via INTRAVENOUS

## 2020-02-02 MED ORDER — EPHEDRINE SULFATE-NACL 50-0.9 MG/10ML-% IV SOSY
PREFILLED_SYRINGE | INTRAVENOUS | Status: DC | PRN
  Administered 2020-02-02: 10 mg via INTRAVENOUS

## 2020-02-02 MED ORDER — MIDAZOLAM HCL 2 MG/2ML IJ SOLN
INTRAMUSCULAR | Status: DC | PRN
  Administered 2020-02-02: 2 mg via INTRAVENOUS

## 2020-02-02 MED ORDER — BUPIVACAINE-EPINEPHRINE (PF) 0.25% -1:200000 IJ SOLN
INTRAMUSCULAR | Status: AC
  Filled 2020-02-02: qty 30

## 2020-02-02 MED ORDER — LIDOCAINE 2% (20 MG/ML) 5 ML SYRINGE
INTRAMUSCULAR | Status: AC
  Filled 2020-02-02: qty 5

## 2020-02-02 SURGICAL SUPPLY — 42 items
APPLIER CLIP ROT 10 11.4 M/L (STAPLE) ×2
BENZOIN TINCTURE PRP APPL 2/3 (GAUZE/BANDAGES/DRESSINGS) ×2 IMPLANT
BNDG ADH 1X3 SHEER STRL LF (GAUZE/BANDAGES/DRESSINGS) ×8 IMPLANT
CABLE HIGH FREQUENCY MONO STRZ (ELECTRODE) ×2 IMPLANT
CATH CHOLANG 76X19 KUMAR (CATHETERS) ×2 IMPLANT
CHLORAPREP W/TINT 26 (MISCELLANEOUS) ×2 IMPLANT
CLIP APPLIE ROT 10 11.4 M/L (STAPLE) ×1 IMPLANT
CLIP VESOLOCK LG 6/CT PURPLE (CLIP) IMPLANT
CLIP VESOLOCK MED LG 6/CT (CLIP) ×2 IMPLANT
COVER MAYO STAND STRL (DRAPES) ×2 IMPLANT
COVER SURGICAL LIGHT HANDLE (MISCELLANEOUS) ×2 IMPLANT
COVER WAND RF STERILE (DRAPES) IMPLANT
DECANTER SPIKE VIAL GLASS SM (MISCELLANEOUS) ×2 IMPLANT
DERMABOND ADVANCED (GAUZE/BANDAGES/DRESSINGS) ×1
DERMABOND ADVANCED .7 DNX12 (GAUZE/BANDAGES/DRESSINGS) ×1 IMPLANT
DRAIN CHANNEL 19F RND (DRAIN) IMPLANT
DRAPE C-ARM 42X120 X-RAY (DRAPES) ×2 IMPLANT
EVACUATOR SILICONE 100CC (DRAIN) IMPLANT
GLOVE BIOGEL PI IND STRL 7.0 (GLOVE) ×1 IMPLANT
GLOVE BIOGEL PI INDICATOR 7.0 (GLOVE) ×1
GLOVE SURG SS PI 7.0 STRL IVOR (GLOVE) ×2 IMPLANT
GOWN STRL REUS W/TWL LRG LVL3 (GOWN DISPOSABLE) ×2 IMPLANT
GOWN STRL REUS W/TWL XL LVL3 (GOWN DISPOSABLE) ×4 IMPLANT
GRASPER SUT TROCAR 14GX15 (MISCELLANEOUS) IMPLANT
KIT BASIN OR (CUSTOM PROCEDURE TRAY) ×2 IMPLANT
KIT TURNOVER KIT A (KITS) IMPLANT
POUCH RETRIEVAL ECOSAC 10 (ENDOMECHANICALS) ×1 IMPLANT
POUCH RETRIEVAL ECOSAC 10MM (ENDOMECHANICALS) ×1
SCISSORS LAP 5X35 DISP (ENDOMECHANICALS) ×2 IMPLANT
SET IRRIG TUBING LAPAROSCOPIC (IRRIGATION / IRRIGATOR) ×2 IMPLANT
SET TUBE SMOKE EVAC HIGH FLOW (TUBING) ×2 IMPLANT
SLEEVE XCEL OPT CAN 5 100 (ENDOMECHANICALS) ×4 IMPLANT
STOPCOCK 4 WAY LG BORE MALE ST (IV SETS) ×2 IMPLANT
STRIP CLOSURE SKIN 1/2X4 (GAUZE/BANDAGES/DRESSINGS) ×2 IMPLANT
SUT ETHILON 2 0 PS N (SUTURE) IMPLANT
SUT MNCRL AB 4-0 PS2 18 (SUTURE) ×2 IMPLANT
SUT VICRYL 0 ENDOLOOP (SUTURE) IMPLANT
TOWEL OR 17X26 10 PK STRL BLUE (TOWEL DISPOSABLE) ×2 IMPLANT
TOWEL OR NON WOVEN STRL DISP B (DISPOSABLE) IMPLANT
TRAY LAPAROSCOPIC (CUSTOM PROCEDURE TRAY) ×2 IMPLANT
TROCAR BLADELESS OPT 5 100 (ENDOMECHANICALS) ×2 IMPLANT
TROCAR XCEL NON-BLD 11X100MML (ENDOMECHANICALS) ×2 IMPLANT

## 2020-02-02 NOTE — Progress Notes (Signed)
PROGRESS NOTE  Andrew Hooper HTD:[id number] DOB: April 16, 1966   PCP: Deland Pretty, MD  Patient is from: Home.  DOA: 01/30/2020 LOS: 2  Brief Narrative / Interim history: 54 year old male with history of OSA on CPAP, depression, hypogonadism?  Premature CAD ascending aortic aneurysm admitted for acute gallstone pancreatitis with lipase to 2000.  CT abdomen and pelvis without necrosis.  No signs of infection.  GI and general surgery consulted.  He was managed conservatively with bowel rest and pain medications with improvement in his pancreatitis.  He underwent laparoscopic cholecystectomy with intraoperative cholangiogram by Dr. Kieth Brightly on 02/02/2020  Subjective: Seen and examined earlier this morning before he went down for laparoscopic cholecystectomy.  No complaints.  Denies pain, nausea, vomiting, chest pain or dyspnea.  Objective: Vitals:   02/02/20 1315 02/02/20 1330 02/02/20 1345 02/02/20 1422  BP: 109/61 127/76 119/72 120/75  Pulse: 80 76 68 65  Resp: 17 14 12 16   Temp:    98 F (36.7 C)  TempSrc:    Oral  SpO2: 97% 94% 93% 96%  Weight:      Height:        Intake/Output Summary (Last 24 hours) at 02/02/2020 1542 Last data filed at 02/02/2020 1309 Gross per 24 hour  Intake 5379.51 ml  Output 20 ml  Net 5359.51 ml   Filed Weights   01/30/20 2325 02/02/20 1044  Weight: 97.5 kg 97.5 kg    Examination:  GENERAL: No apparent distress.  Nontoxic. HEENT: MMM.  Vision and hearing grossly intact.  NECK: Supple.  No apparent JVD.  RESP:  No IWOB.  Fair aeration bilaterally. CVS:  RRR. Heart sounds normal.  ABD/GI/GU: BS+. Abd soft, NTND.  MSK/EXT:  Moves extremities. No apparent deformity. No edema.  SKIN: no apparent skin lesion or wound NEURO: Awake, alert and oriented appropriately.  No apparent focal neuro deficit. PSYCH: Calm. Normal affect.  Procedures:  02/02/2020-laparoscopic cholecystectomy with intraoperative cholangiogram by Dr. Kieth Brightly  Microbiology  summarized: COVID-19 PCR negative.  Assessment & Plan: Acute gallstone pancreatitis-RUQ ultrasound with small amount of sludge in the gallbladder with small stones but no evidence of cholecystitis.  Lipase trended down from 2000-173.  LFT improved.  Pain resolved.  Status post lap chole on 02/02/2020. -Continue monitoring LFT and lipase. -On clear liquid diet. -Pain control, IV fluid  Elevated liver enzymes: Likely due to the above.  Improving. -Continue monitoring  OSA on CPAP -Nightly CPAP.  Hypogonadism?  Previously on testosterone replacement. -Outpatient follow-up with PCP or urology  History of ascending aortic aneurysm -Outpatient follow-up.  Body mass index is 29.99 kg/m.         DVT prophylaxis:  SCD's Start: 02/02/20 1418 heparin injection 5,000 Units Start: 01/31/20 0600  Code Status: Full code Family Communication: Patient and/or RN. Available if any question.  Status is: Inpatient  Remains inpatient appropriate because:IV treatments appropriate due to intensity of illness or inability to take PO and Inpatient level of care appropriate due to severity of illness   Dispo:  Patient From: Home  Planned Disposition: Home  Expected discharge date: 02/04/20  Medically stable for discharge: No        Consultants:  General surgery GI   Sch Meds:  Scheduled Meds: . acetaminophen  1,000 mg Oral Q6H  . escitalopram  10 mg Oral Daily  . gabapentin  300 mg Oral BID  . heparin  5,000 Units Subcutaneous Q8H  . HYDROmorphone      . ketorolac  Continuous Infusions: .  ceFAZolin (ANCEF) IV    . lactated ringers     PRN Meds:.acetaminophen **OR** acetaminophen, ibuprofen, labetalol, methocarbamol, morphine injection, ondansetron **OR** ondansetron (ZOFRAN) IV, simethicone, traMADol  Antimicrobials: Anti-infectives (From admission, onward)   Start     Dose/Rate Route Frequency Ordered Stop   02/02/20 0745  ceFAZolin (ANCEF) IVPB 2g/100 mL premix         2 g 200 mL/hr over 30 Minutes Intravenous  Once 02/02/20 0734         I have personally reviewed the following labs and images: CBC: Recent Labs  Lab 01/30/20 2345 01/31/20 0537 02/01/20 0522 02/02/20 0511  WBC 11.3* 11.4* 8.0 6.1  NEUTROABS  --  10.5* 6.1 4.0  HGB 15.7 13.2 12.1* 12.4*  HCT 48.5 41.0 38.4* 39.2  MCV 92.4 93.0 94.1 94.2  PLT 234 152 118* 130*   BMP &GFR Recent Labs  Lab 01/30/20 2345 01/31/20 0537 02/01/20 0522 02/02/20 0511  NA 143 140 136 138  K 4.0 3.9 3.9 4.0  CL 101 108 104 106  CO2 31 26 26 24   GLUCOSE 109* 120* 76 81  BUN 24* 24* 20 10  CREATININE 0.97 0.90 0.96 0.97  CALCIUM 9.8 8.7* 8.4* 8.5*   Estimated Creatinine Clearance: 101.3 mL/min (by C-G formula based on SCr of 0.97 mg/dL). Liver & Pancreas: Recent Labs  Lab 01/30/20 2345 01/31/20 0537 02/01/20 0522 02/02/20 0511  AST 106* 226* 86* 46*  ALT 59* 197* 162* 113*  ALKPHOS 102 100 85 88  BILITOT 0.9 0.9 1.1 0.9  PROT 6.6 5.3* 5.3* 5.5*  ALBUMIN 4.2 3.4* 3.1* 3.1*   Recent Labs  Lab 01/30/20 2345 02/01/20 0522 02/02/20 0511  LIPASE 2,235* 1,033* 173*   No results for input(s): AMMONIA in the last 168 hours. Diabetic: No results for input(s): HGBA1C in the last 72 hours. Recent Labs  Lab 01/31/20 2336 02/01/20 0712 02/01/20 1806 02/02/20 0011 02/02/20 0800  GLUCAP 69* 68* 81 75 76   Cardiac Enzymes: No results for input(s): CKTOTAL, CKMB, CKMBINDEX, TROPONINI in the last 168 hours. No results for input(s): PROBNP in the last 8760 hours. Coagulation Profile: Recent Labs  Lab 02/01/20 0522 02/02/20 0511  INR 1.3* 1.1   Thyroid Function Tests: No results for input(s): TSH, T4TOTAL, FREET4, T3FREE, THYROIDAB in the last 72 hours. Lipid Profile: Recent Labs    01/31/20 0313  TRIG 52   Anemia Panel: No results for input(s): VITAMINB12, FOLATE, FERRITIN, TIBC, IRON, RETICCTPCT in the last 72 hours. Urine analysis:    Component Value Date/Time    COLORURINE YELLOW 01/31/2020 0605   APPEARANCEUR CLEAR 01/31/2020 0605   LABSPEC 1.024 01/31/2020 0605   PHURINE 7.0 01/31/2020 0605   GLUCOSEU NEGATIVE 01/31/2020 0605   HGBUR NEGATIVE 01/31/2020 0605   BILIRUBINUR NEGATIVE 01/31/2020 0605   KETONESUR NEGATIVE 01/31/2020 0605   PROTEINUR NEGATIVE 01/31/2020 0605   NITRITE NEGATIVE 01/31/2020 0605   LEUKOCYTESUR NEGATIVE 01/31/2020 0605   Sepsis Labs: Invalid input(s): PROCALCITONIN, [place]  Microbiology: Recent Results (from the past 240 hour(s))  SARS Coronavirus 2 by RT PCR (hospital order, performed in [facility] hospital lab) Nasopharyngeal Nasopharyngeal Swab     Status: None   Collection Time: 01/31/20  4:19 AM   Specimen: Nasopharyngeal Swab  Result Value Ref Range Status   SARS Coronavirus 2 NEGATIVE NEGATIVE Final    Comment: (NOTE) SARS-CoV-2 target nucleic acids are NOT DETECTED.  The SARS-CoV-2 RNA is generally detectable in upper and lower respiratory  specimens during the acute phase of infection. The lowest concentration of SARS-CoV-2 viral copies this assay can detect is 250 copies / mL. A negative result does not preclude SARS-CoV-2 infection and should not be used as the sole basis for treatment or other patient management decisions.  A negative result may occur with improper specimen collection / handling, submission of specimen other than nasopharyngeal swab, presence of viral mutation(s) within the areas targeted by this assay, and inadequate number of viral copies (<250 copies / mL). A negative result must be combined with clinical observations, patient history, and epidemiological information.  Fact Sheet for Patients:   StrictlyIdeas.no  Fact Sheet for Healthcare Providers: BankingDealers.co.za  This test is not yet approved or  cleared by the [place] FDA and has been authorized for detection and/or diagnosis of SARS-CoV-2 by FDA under an  Emergency Use Authorization (EUA).  This EUA will remain in effect (meaning this test can be used) for the duration of the COVID-19 declaration under Section 564(b)(1) of the Act, 21 U.S.C. section 360bbb-3(b)(1), unless the authorization is terminated or revoked sooner.  Performed at [facility], [place] [address], [place], [place] [address]   Surgical pcr screen     Status: None   Collection Time: 02/01/20  9:12 PM   Specimen: Nasal Mucosa; Nasal Swab  Result Value Ref Range Status   MRSA, PCR NEGATIVE NEGATIVE Final   Staphylococcus aureus NEGATIVE NEGATIVE Final    Comment: (NOTE) The Xpert SA Assay (FDA approved for NASAL specimens in patients 58 years of age and older), is one component of a comprehensive surveillance program. It is not intended to diagnose infection nor to guide or monitor treatment. Performed at [facility], [place] [address] [place], [place] [address]     Radiology Studies: No results found.    Madailein Londo T. Paoli  If 7PM-7AM, please contact night-coverage [email or web address] 02/02/2020, 3:42 PM

## 2020-02-02 NOTE — Anesthesia Preprocedure Evaluation (Addendum)
Anesthesia Evaluation  Patient identified by MRN, date of birth, ID band Patient awake    Reviewed: Allergy & Precautions, NPO status , Patient's Chart, lab work & pertinent test results  Airway Mallampati: II  TM Distance: >3 FB Neck ROM: Full    Dental no notable dental hx. (+) Teeth Intact   Pulmonary sleep apnea ,    Pulmonary exam normal breath sounds clear to auscultation       Cardiovascular hypertension, Pt. on medications and Pt. on home beta blockers Normal cardiovascular exam Rhythm:Regular Rate:Normal  Hx of 4.5 cm AA being followed by the [facility]   Neuro/Psych negative neurological ROS  negative psych ROS   GI/Hepatic Neg liver ROS, GERD  ,  Endo/Other    Renal/GU negative Renal ROSK+ 4.0     Musculoskeletal negative musculoskeletal ROS (+)   Abdominal (+) + obese,   Peds  Hematology negative hematology ROS (+) Hgb 12.4   Anesthesia Other Findings   Reproductive/Obstetrics negative OB ROS                             Anesthesia Physical Anesthesia Plan  ASA: III  Anesthesia Plan: General   Post-op Pain Management:    Induction: Intravenous  PONV Risk Score and Plan: 3 and Treatment may vary due to age or medical condition, Ondansetron, Dexamethasone and Midazolam  Airway Management Planned: Oral ETT  Additional Equipment: None  Intra-op Plan:   Post-operative Plan: Extubation in OR  Informed Consent: I have reviewed the patients History and Physical, chart, labs and discussed the procedure including the risks, benefits and alternatives for the proposed anesthesia with the patient or authorized representative who has indicated his/her understanding and acceptance.     Interpreter used for [employer] Discussed with: CRNA  Anesthesia Plan Comments:         Anesthesia Quick Evaluation

## 2020-02-02 NOTE — Progress Notes (Signed)
Pre Procedure note for inpatients:   Andrew Hooper has been scheduled for Procedure(s): LAPAROSCOPIC CHOLECYSTECTOMY WITH INTRAOPERATIVE CHOLANGIOGRAM (N/A) today. The various methods of treatment have been discussed with the patient. After consideration of the risks, benefits and treatment options the patient has consented to the planned procedure.   The patient has been seen and labs reviewed. There are no changes in the patient's condition to prevent proceeding with the planned procedure today.  Recent labs:  Lab Results  Component Value Date   WBC 6.1 02/02/2020   HGB 12.4 (L) 02/02/2020   HCT 39.2 02/02/2020   PLT 130 (L) 02/02/2020   GLUCOSE 81 02/02/2020   CHOL 143 06/20/2011   TRIG 52 01/31/2020   HDL 34 (L) 06/20/2011   LDLCALC 88 06/20/2011   ALT 113 (H) 02/02/2020   AST 46 (H) 02/02/2020   NA 138 02/02/2020   K 4.0 02/02/2020   CL 106 02/02/2020   CREATININE 0.97 02/02/2020   BUN 10 02/02/2020   CO2 24 02/02/2020   TSH 3.450 06/20/2011   INR 1.1 02/02/2020   HGBA1C 5.3 06/20/2011    [name], MD 02/02/2020 8:09 AM

## 2020-02-02 NOTE — Op Note (Signed)
PATIENT:  Andrew Hooper  54 y.o. male  PRE-OPERATIVE DIAGNOSIS:  gallstones/pancreatitis  POST-OPERATIVE DIAGNOSIS:  gallstones/pancreatitis   PROCEDURE:  Procedure(s): LAPAROSCOPIC CHOLECYSTECTOMY WITH INTRAOPERATIVE CHOLANGIOGRAM   SURGEON:  Surgeon(s): Creedence Heiss, Arta Bruce, MD  ASSISTANT: Barkley Boards, [facility]  ANESTHESIA:   local and general  Indications for procedure: Andrew Hooper is a 54 y.o. male with symptoms of Abdominal pain and Nausea and vomiting consistent with gallbladder disease, Confirmed by Ultrasound and CT.  Description of procedure: The patient was brought into the operative suite, placed supine. Anesthesia was administered with endotracheal tube. Patient was strapped in place and foot board was secured. All pressure points were offloaded by foam padding. The patient was prepped and draped in the usual sterile fashion.  A small incision was made to the right of the umbilicus. A 106mm trocar was inserted into the peritoneal cavity with optical entry. Pneumoperitoneum was applied with high flow low pressure. 2 46mm trocars were placed in the RUQ. A 27mm trocar was placed in the subxiphoid space. Marcaine was infused to the subxiphoid space and lateral upper right abdomen in the transversus abdominis plane. Next the patient was placed in reverse trendelenberg. The gallbladder was distended and pink in color.  The gallbladder was retracted cephalad and lateral. The peritoneum was reflected off the infundibulum working lateral to medial. The cystic duct and cystic artery were identified and further dissection revealed a critical view, due to concern for choledocholithiasis a cholangiogram was performed with ductotomy and cook catheter passed through a separate subcostal stab incision. Normal ductal anatomy was seen with some retrofilling of the pancreatic duct The cystic duct and cystic artery were doubly clipped and ligated.   The gallbladder was removed off the liver bed  with cautery. The Gallbladder was placed in a specimen bag. The gallbladder fossa was irrigated and hemostasis was applied with cautery. The gallbladder was removed via the 55mm trocar. The fascial defect was closed with interrupted 0 vicryl suture via laparoscopic trans-fascial suture passer. Pneumoperitoneum was removed, all trocar were removed. All incisions were closed with 4-0 monocryl subcuticular stitch. The patient woke from anesthesia and was brought to PACU in stable condition. All counts were correct  Findings: distended gallbladder, no choledocholithiasis  Specimen: gallbladder  Blood loss: 20 ml  Local anesthesia: 30 ml marcaine  Complications: none  PLAN OF CARE: Admit to inpatient   PATIENT DISPOSITION:  PACU - hemodynamically stable.  Images:     Gurney Maxin, M.D. General, Bariatric, & Minimally Invasive Surgery [facility]

## 2020-02-02 NOTE — Transfer of Care (Signed)
Immediate Anesthesia Transfer of Care Note  Patient: Andrew Hooper  Procedure(s) Performed: LAPAROSCOPIC CHOLECYSTECTOMY WITH INTRAOPERATIVE CHOLANGIOGRAM (N/A )  Patient Location: PACU  Anesthesia Type:General  Level of Consciousness: awake, alert  and oriented  Airway & Oxygen Therapy: Patient Spontanous Breathing and Patient connected to face mask oxygen  Post-op Assessment: Report given to RN, Post -op Vital signs reviewed and stable and Patient moving all extremities X 4  Post vital signs: Reviewed and stable  Last Vitals:  Vitals Value Taken Time  BP 123/67 02/02/20 1307  Temp    Pulse 78 02/02/20 1310  Resp 15 02/02/20 1310  SpO2 100 % 02/02/20 1310  Vitals shown include unvalidated device data.  Last Pain:  Vitals:   02/02/20 1058  TempSrc: Oral  PainSc:       Patients Stated Pain Goal: 1 (00/37/94 4461)  Complications: No complications documented.

## 2020-02-02 NOTE — Discharge Instructions (Signed)
[facility] LAPAROSCOPIC SURGERY: POST OP INSTRUCTIONS Always review your discharge instruction sheet given to you by the facility where your surgery was performed. IF YOU HAVE DISABILITY OR FAMILY LEAVE FORMS, YOU MUST BRING THEM TO THE OFFICE FOR PROCESSING.   DO NOT GIVE THEM TO YOUR DOCTOR.  PAIN CONTROL  1. First take acetaminophen (Tylenol) AND/or ibuprofen (Advil) to control your pain after surgery.  Follow directions on package.  Taking acetaminophen (Tylenol) and/or ibuprofen (Advil) regularly after surgery will help to control your pain and lower the amount of prescription pain medication you may need.  You should not take more than 3,000 mg (3 grams) of acetaminophen (Tylenol) in 24 hours.  You should not take ibuprofen (Advil), aleve, motrin, naprosyn or other NSAIDS if you have a history of stomach ulcers or chronic kidney disease.  2. A prescription for pain medication may be given to you upon discharge.  Take your pain medication as prescribed, if you still have uncontrolled pain after taking acetaminophen (Tylenol) or ibuprofen (Advil). 3. Use ice packs to help control pain. 4. If you need a refill on your pain medication, please contact your pharmacy.  They will contact our office to request authorization. Prescriptions will not be filled after 5pm or on week-ends.  HOME MEDICATIONS 5. Take your usually prescribed medications unless otherwise directed.  DIET 6. You should follow a light diet the first few days after arrival home.  Be sure to include lots of fluids daily. Avoid fatty, fried foods.   CONSTIPATION 7. It is common to experience some constipation after surgery and if you are taking pain medication.  Increasing fluid intake and taking a stool softener (such as Colace) will usually help or prevent this problem from occurring.  A mild laxative (Milk of Magnesia or Miralax) should be taken according to package instructions if there are no bowel  movements after 48 hours.  WOUND/INCISION CARE 8. Most patients will experience some swelling and bruising in the area of the incisions.  Ice packs will help.  Swelling and bruising can take several days to resolve.  9. Unless discharge instructions indicate otherwise, follow guidelines below  a. STERI-STRIPS - you may remove your outer bandages 48 hours after surgery, and you may shower at that time.  You have steri-strips (small skin tapes) in place directly over the incision.  These strips should be left on the skin for 7-10 days.   b. DERMABOND/SKIN GLUE - you may shower in 24 hours.  The glue will flake off over the next 2-3 weeks. 10. Any sutures or staples will be removed at the office during your follow-up visit.  ACTIVITIES 11. You may resume regular (light) daily activities beginning the next day--such as daily self-care, walking, climbing stairs--gradually increasing activities as tolerated.  You may have sexual intercourse when it is comfortable.  Refrain from any heavy lifting or straining until approved by your doctor. a. You may drive when you are no longer taking prescription pain medication, you can comfortably wear a seatbelt, and you can safely maneuver your car and apply brakes.  FOLLOW-UP 12. You should see your doctor in the office for a follow-up appointment approximately 2-3 weeks after your surgery.  You should have been given your post-op/follow-up appointment when your surgery was scheduled.  If you did not receive a post-op/follow-up appointment, make sure that you call for this appointment within a day or two after you arrive home to insure a convenient appointment time.     WHEN TO CALL YOUR DOCTOR: 1. Fever over 101.0 2. Inability to urinate 3. Continued bleeding from incision. 4. Increased pain, redness, or drainage from the incision. 5. Increasing abdominal pain  The clinic staff is available to answer your questions during regular business hours.  Please don't  hesitate to call and ask to speak to one of the nurses for clinical concerns.  If you have a medical emergency, go to the nearest emergency room or call 911.  A surgeon from [facility] is always on call at the hospital. [address], [address], [place], [place]  [address] ? [address] [address], [place], [place]   [address] [phone] ? [phone] ? FAX [phone] Web site: [email or web address]  .........   Managing Your Pain After Surgery Without Opioids    Thank you for participating in our program to help patients manage their pain after surgery without opioids. This is part of our effort to provide you with the best care possible, without exposing you or your family to the risk that opioids pose.  What pain can I expect after surgery? You can expect to have some pain after surgery. This is normal. The pain is typically worse the day after surgery, and quickly begins to get better. Many studies have found that many patients are able to manage their pain after surgery with Over-the-Counter (OTC) medications such as Tylenol and Motrin. If you have a condition that does not allow you to take Tylenol or Motrin, notify your surgical team.  How will I manage my pain? The best strategy for controlling your pain after surgery is around the clock pain control with Tylenol (acetaminophen) and Motrin (ibuprofen or Advil). Alternating these medications with each other allows you to maximize your pain control. In addition to Tylenol and Motrin, you can use heating pads or ice packs on your incisions to help reduce your pain.  How will I alternate your regular strength over-the-counter pain medication? You will take a dose of pain medication every three hours. ; Start by taking 650 mg of Tylenol (2 pills of 325 mg) ; 3 hours later take 600 mg of Motrin (3 pills of 200 mg) ; 3 hours after taking the Motrin take 650 mg of Tylenol ; 3 hours after that take 600 mg of  Motrin.   - 1 -  See example - if your first dose of Tylenol is at 12:00 PM   12:00 PM Tylenol 650 mg (2 pills of 325 mg)  3:00 PM Motrin 600 mg (3 pills of 200 mg)  6:00 PM Tylenol 650 mg (2 pills of 325 mg)  9:00 PM Motrin 600 mg (3 pills of 200 mg)  Continue alternating every 3 hours   We recommend that you follow this schedule around-the-clock for at least 3 days after surgery, or until you feel that it is no longer needed. Use the table on the last page of this handout to keep track of the medications you are taking. Important: Do not take more than 3000mg of Tylenol or 3200mg of Motrin in a 24-hour period. Do not take ibuprofen/Motrin if you have a history of bleeding stomach ulcers, severe kidney disease, &/or actively taking a blood thinner  What if I still have pain? If you have pain that is not controlled with the over-the-counter pain medications (Tylenol and Motrin or Advil) you might have what we call "breakthrough" pain. You will receive a prescription for a small amount of an opioid pain medication such as   Oxycodone, Tramadol, or Tylenol with Codeine. Use these opioid pills in the first 24 hours after surgery if you have breakthrough pain. Do not take more than 1 pill every 4-6 hours.  If you still have uncontrolled pain after using all opioid pills, don't hesitate to call our staff using the number provided. We will help make sure you are managing your pain in the best way possible, and if necessary, we can provide a prescription for additional pain medication.   Day 1    Time  Name of Medication Number of pills taken  Amount of Acetaminophen  Pain Level   Comments  AM PM       AM PM       AM PM       AM PM       AM PM       AM PM       AM PM       AM PM       Total Daily amount of Acetaminophen Do not take more than  3,000 mg per day      Day 2    Time  Name of Medication Number of pills taken  Amount of Acetaminophen  Pain Level   Comments  AM  PM       AM PM       AM PM       AM PM       AM PM       AM PM       AM PM       AM PM       Total Daily amount of Acetaminophen Do not take more than  3,000 mg per day      Day 3    Time  Name of Medication Number of pills taken  Amount of Acetaminophen  Pain Level   Comments  AM PM       AM PM       AM PM       AM PM          AM PM       AM PM       AM PM       AM PM       Total Daily amount of Acetaminophen Do not take more than  3,000 mg per day      Day 4    Time  Name of Medication Number of pills taken  Amount of Acetaminophen  Pain Level   Comments  AM PM       AM PM       AM PM       AM PM       AM PM       AM PM       AM PM       AM PM       Total Daily amount of Acetaminophen Do not take more than  3,000 mg per day      Day 5    Time  Name of Medication Number of pills taken  Amount of Acetaminophen  Pain Level   Comments  AM PM       AM PM       AM PM       AM PM       AM PM       AM PM       AM PM         AM PM       Total Daily amount of Acetaminophen Do not take more than  3,000 mg per day       Day 6    Time  Name of Medication Number of pills taken  Amount of Acetaminophen  Pain Level  Comments  AM PM       AM PM       AM PM       AM PM       AM PM       AM PM       AM PM       AM PM       Total Daily amount of Acetaminophen Do not take more than  3,000 mg per day      Day 7    Time  Name of Medication Number of pills taken  Amount of Acetaminophen  Pain Level   Comments  AM PM       AM PM       AM PM       AM PM       AM PM       AM PM       AM PM       AM PM       Total Daily amount of Acetaminophen Do not take more than  3,000 mg per day        For additional information about how and where to safely dispose of unused opioid medications - https://www.morepowerful[employer].org  Disclaimer: This document contains information and/or instructional materials adapted from [facility]  for the typical patient with your condition. It does not replace medical advice from your health care provider because your experience may differ from that of the typical patient. Talk to your health care provider if you have any questions about this document, your condition or your treatment plan. Adapted from [facility]  

## 2020-02-02 NOTE — Anesthesia Procedure Notes (Signed)
Procedure Name: Intubation Date/Time: 02/02/2020 11:57 AM Performed by: Niel Hummer, CRNA Pre-anesthesia Checklist: Patient identified, Emergency Drugs available, Suction available and Patient being monitored Patient Re-evaluated:Patient Re-evaluated prior to induction Oxygen Delivery Method: Circle system utilized Preoxygenation: Pre-oxygenation with 100% oxygen Induction Type: IV induction Ventilation: Mask ventilation without difficulty Laryngoscope Size: Mac and 4 Grade View: Grade I Tube type: Oral Tube size: 7.5 mm Number of attempts: 1 Airway Equipment and Method: Stylet Placement Confirmation: ETT inserted through vocal cords under direct vision,  positive ETCO2 and breath sounds checked- equal and bilateral Secured at: 23 cm Tube secured with: Tape Dental Injury: Teeth and Oropharynx as per pre-operative assessment

## 2020-02-02 NOTE — Anesthesia Postprocedure Evaluation (Signed)
Anesthesia Post Note  Patient: Andrew Hooper  Procedure(s) Performed: LAPAROSCOPIC CHOLECYSTECTOMY WITH INTRAOPERATIVE CHOLANGIOGRAM (N/A )     Patient location during evaluation: PACU Anesthesia Type: General Level of consciousness: awake and alert Pain management: pain level controlled Vital Signs Assessment: post-procedure vital signs reviewed and stable Respiratory status: spontaneous breathing, nonlabored ventilation, respiratory function stable and patient connected to nasal cannula oxygen Cardiovascular status: blood pressure returned to baseline and stable Postop Assessment: no apparent nausea or vomiting Anesthetic complications: no   No complications documented.  Last Vitals:  Vitals:   02/02/20 1330 02/02/20 1345  BP: 127/76 119/72  Pulse: 76 68  Resp: 14 12  Temp:    SpO2: 94% 93%    Last Pain:  Vitals:   02/02/20 1330  TempSrc:   PainSc: 4                  Barnet Glasgow

## 2020-02-03 ENCOUNTER — Encounter (HOSPITAL_COMMUNITY): Payer: Self-pay | Admitting: General Surgery

## 2020-02-03 DIAGNOSIS — I712 Thoracic aortic aneurysm, without rupture: Secondary | ICD-10-CM

## 2020-02-03 LAB — CBC WITH DIFFERENTIAL/PLATELET
Abs Immature Granulocytes: 0.02 10*3/uL (ref 0.00–0.07)
Basophils Absolute: 0 10*3/uL (ref 0.0–0.1)
Basophils Relative: 0 %
Eosinophils Absolute: 0 10*3/uL (ref 0.0–0.5)
Eosinophils Relative: 0 %
HCT: 40 % (ref 39.0–52.0)
Hemoglobin: 12.8 g/dL — ABNORMAL LOW (ref 13.0–17.0)
Immature Granulocytes: 0 %
Lymphocytes Relative: 12 %
Lymphs Abs: 0.7 10*3/uL (ref 0.7–4.0)
MCH: 29.6 pg (ref 26.0–34.0)
MCHC: 32 g/dL (ref 30.0–36.0)
MCV: 92.4 fL (ref 80.0–100.0)
Monocytes Absolute: 0.4 10*3/uL (ref 0.1–1.0)
Monocytes Relative: 7 %
Neutro Abs: 5 10*3/uL (ref 1.7–7.7)
Neutrophils Relative %: 81 %
Platelets: 151 10*3/uL (ref 150–400)
RBC: 4.33 MIL/uL (ref 4.22–5.81)
RDW: 14.6 % (ref 11.5–15.5)
WBC: 6.1 10*3/uL (ref 4.0–10.5)
nRBC: 0 % (ref 0.0–0.2)

## 2020-02-03 LAB — COMPREHENSIVE METABOLIC PANEL
ALT: 86 U/L — ABNORMAL HIGH (ref 0–44)
AST: 27 U/L (ref 15–41)
Albumin: 3.1 g/dL — ABNORMAL LOW (ref 3.5–5.0)
Alkaline Phosphatase: 92 U/L (ref 38–126)
Anion gap: 7 (ref 5–15)
BUN: 14 mg/dL (ref 6–20)
CO2: 25 mmol/L (ref 22–32)
Calcium: 8.3 mg/dL — ABNORMAL LOW (ref 8.9–10.3)
Chloride: 105 mmol/L (ref 98–111)
Creatinine, Ser: 0.86 mg/dL (ref 0.61–1.24)
GFR calc Af Amer: >60 mL/min (ref >60)
GFR calc non Af Amer: >60 mL/min (ref >60)
Glucose, Bld: 131 mg/dL — ABNORMAL HIGH (ref 70–99)
Potassium: 4.6 mmol/L (ref 3.5–5.1)
Sodium: 137 mmol/L (ref 135–145)
Total Bilirubin: 0.5 mg/dL (ref 0.3–1.2)
Total Protein: 5.8 g/dL — ABNORMAL LOW (ref 6.5–8.1)

## 2020-02-03 LAB — LIPASE, BLOOD: Lipase: 38 U/L (ref 11–51)

## 2020-02-03 LAB — PROTIME-INR
INR: 1 (ref 0.8–1.2)
Prothrombin Time: 13.1 seconds (ref 11.4–15.2)

## 2020-02-03 LAB — SURGICAL PATHOLOGY

## 2020-02-03 LAB — GLUCOSE, CAPILLARY: Glucose-Capillary: 121 mg/dL — ABNORMAL HIGH (ref 70–99)

## 2020-02-03 MED ORDER — IBUPROFEN 200 MG PO TABS: 600.0000 mg | ORAL_TABLET | Freq: Four times a day (QID) | ORAL | Status: DC | PRN

## 2020-02-03 MED ORDER — TRAMADOL HCL 50 MG PO TABS: 50.0000 mg | ORAL_TABLET | Freq: Four times a day (QID) | ORAL | 0 refills | Status: DC | PRN

## 2020-02-03 NOTE — Discharge Summary (Signed)
Physician Discharge Summary  Andrew Hooper ZOX:[id number] DOB: 10-25-1965 DOA: 01/30/2020  PCP: Deland Pretty, MD  Admit date: 01/30/2020 Discharge date: 02/03/2020  Admitted From: Home Disposition: Home  Recommendations for Outpatient Follow-up:  1. Follow ups as below. 2. Please obtain CBC/BMP/Mag at follow up 3. Please follow up on the following pending results: None  Home Health: None required Equipment/Devices: None required  Discharge Condition: Stable CODE STATUS: Full code   Follow-up Information    Surgery, [place]. Go on 02/22/2020.   Specialty: General Surgery Why: Follow up appointment scheduled for 9:45 AM. Please arrive 30 min prior to appointment time. Bring photo ID and insurance information.  Contact information: [place] [address] [phone]        Deland Pretty, MD. Schedule an appointment as soon as possible for a visit in 1 week(s).   Specialty: Internal Medicine Contact information: [address] [place] [address] [phone]        Pixie Casino, MD .   Specialty: Cardiology Contact information: [place] [address] [phone]                Hospital Course: 54 year old male with history of OSA on CPAP, depression, hypogonadism?  Premature CAD ascending aortic aneurysm admitted for acute gallstone pancreatitis with lipase to 2000.  CT abdomen and pelvis without necrosis.  No signs of infection.  GI and general surgery consulted.  He was managed conservatively with bowel rest and pain medications with improvement in his pancreatitis.  He underwent laparoscopic cholecystectomy with intraoperative cholangiogram by Dr. Kieth Brightly on 02/02/2020  On the day of discharge, symptoms resolved.  Tolerated regular diet.  Lipase normalized.  LFT nearly resolved.  Patient discharged home in stable condition to follow-up with PCP and general surgery as above.    See individual problem list below for more on hospital course.  Discharge Diagnoses:  Acute gallstone pancreatitis-RUQ ultrasound with small amount of sludge in the gallbladder with small stones but no evidence of cholecystitis.  Lipase trended down from 2000-38.  ALT 88. Pain resolved.  Status post lap chole on 02/02/2020.  Tolerating regular diet. -Outpatient follow-up with PCP and general surgery -Recheck CMP at follow-up.  Elevated liver enzymes: Likely due to the above.  Resolving. -Recheck CMP at follow-up  OSA on CPAP -Nightly CPAP.  Hypogonadism?  Previously on testosterone replacement. -Outpatient follow-up with PCP or urology  History of ascending aortic aneurysm -Outpatient follow-up with cardiology  Body mass index is 29.99 kg/m.            Discharge Exam: Vitals:   02/03/20 0532 02/03/20 0555  BP: 114/78   Pulse: (!) 45 (!) 47  Resp: 18   Temp: 98.3 F (36.8 C)   SpO2: 99%     GENERAL: No apparent distress.  Nontoxic. HEENT: MMM.  Vision and hearing grossly intact.  NECK: Supple.  No apparent JVD.  RESP:  No IWOB.  Fair aeration bilaterally. CVS:  RRR. Heart sounds normal.  ABD/GI/GU: Bowel sounds present. Soft. Non tender.  MSK/EXT:  Moves extremities. No apparent deformity. No edema.  SKIN: no apparent skin lesion or wound NEURO: Awake, alert and oriented appropriately.  No apparent focal neuro deficit. PSYCH: Calm. Normal affect.  Discharge Instructions  Discharge Instructions    Call MD for:  difficulty breathing, headache or visual disturbances   Complete by: As directed    Call MD for:  extreme fatigue  Complete by: As directed    Call MD for:  persistant dizziness or light-headedness   Complete by: As directed    Call MD for:  persistant nausea and vomiting   Complete by: As directed    Call MD for:  severe uncontrolled pain   Complete by: As directed    Call MD for:  temperature >100.4   Complete by: As directed    Diet -  low sodium heart healthy   Complete by: As directed    Discharge instructions   Complete by: As directed    It has been a pleasure taking care of you!  You were hospitalized acute pancreatitis likely due to gallstone.  You were treated surgically medically.  Your pancreatitis has resolved.  Please follow-up with your primary care doctor in 1 to 2 weeks.  Follow-up with the surgeon as recommended.   We may have started you on other new medications or made some changes to your home medications during this hospitalization. Please review your new medication list and the directions carefully before you take them.   Take care,   Increase activity slowly   Complete by: As directed    No dressing needed   Complete by: As directed      Allergies as of 02/03/2020      Reactions   Sulfamethoxazole Rash   Sulfa based antibiotics       Medication List    TAKE these medications   acetaminophen 500 MG tablet Commonly known as: TYLENOL Take by mouth.   Cholecalciferol 50 MCG (2000 UT) Caps Take by mouth.   escitalopram 10 MG tablet Commonly known as: LEXAPRO Take 10 mg by mouth daily.   ibuprofen 200 MG tablet Commonly known as: ADVIL Take 3 tablets (600 mg total) by mouth every 6 (six) hours as needed (for mild pain not relieved by other medications.).   irbesartan 300 MG tablet Commonly known as: AVAPRO Take 300 mg by mouth daily.   loratadine 10 MG tablet Commonly known as: CLARITIN Take 1 tablet by mouth daily as needed for allergies.   traMADol 50 MG tablet Commonly known as: ULTRAM Take 1-2 tablets (50-100 mg total) by mouth every 6 (six) hours as needed for moderate pain.   zolpidem 10 MG tablet Commonly known as: AMBIEN Take 10 mg by mouth at bedtime as needed for sleep.            Discharge Care Instructions  (From admission, onward)         Start     Ordered   02/03/20 0000  No dressing needed        02/03/20 7106          Consultations:  General  surgery  Procedures/Studies:  02/02/2020-laparoscopic cholecystectomy with intraoperative cholangiogram by Dr. Kieth Brightly   US Abdomen Complete  Result Date: 01/31/2020 CLINICAL DATA:  Upper abdominal pain EXAM: ABDOMEN ULTRASOUND COMPLETE COMPARISON:  CT 01/31/2020 FINDINGS: Gallbladder: Small amount of sludge with small stones in the gallbladder. Normal wall thickness. Negative sonographic Murphy. Common bile duct: Diameter: 3 mm Liver: Borderline to slightly enlarged. Within normal limits for echogenicity. Portal vein is patent on color Doppler imaging with normal direction of blood flow towards the liver. IVC: No abnormality visualized. Pancreas: Limited evaluation due to bowel gas. Spleen: Size and appearance within normal limits. Right Kidney: Length: 12.3 cm. Echogenicity within normal limits. No mass or hydronephrosis visualized. Left Kidney: Length: 13.3 cm. Echogenicity within normal limits. No mass or hydronephrosis visualized.  Abdominal aorta: No aneurysm visualized. Other findings: None. IMPRESSION: 1. Small amount of sludge in the gallbladder with small stones. Negative for acute cholecystitis. 2. Borderline to slightly enlarged liver. Electronically Signed   By: Donavan Foil M.D.   On: 01/31/2020 19:27   CT ABDOMEN PELVIS W CONTRAST  Result Date: 01/31/2020 CLINICAL DATA:  Sudden onset upper abdominal pain EXAM: CT ABDOMEN AND PELVIS WITH CONTRAST TECHNIQUE: Multidetector CT imaging of the abdomen and pelvis was performed using the standard protocol following bolus administration of intravenous contrast. CONTRAST:  163mL OMNIPAQUE IOHEXOL 300 MG/ML  SOLN COMPARISON:  None. FINDINGS: Lower chest: No acute pleural or parenchymal lung disease. Hepatobiliary: No focal liver abnormality is seen. No gallstones, gallbladder wall thickening, or biliary dilatation. Pancreas: There is fat stranding surrounding the head of the pancreas consistent with acute uncomplicated pancreatitis. The pancreatic  parenchyma enhances normally. Spleen: Normal in size without focal abnormality. Adrenals/Urinary Tract: There are bilateral nonobstructing renal calculi, largest on the right measuring 7 mm and on the left measuring 9 mm. No obstructive uropathy within either kidney. The ureters and bladder are grossly normal. Adrenals are unremarkable. Stomach/Bowel: No bowel obstruction or ileus. Normal appendix right lower quadrant. Postsurgical changes from prior sigmoid colon resection and reanastomosis. No bowel wall thickening. Fat stranding surrounding the duodenum likely related to the pancreatitis described above. Vascular/Lymphatic: There is minimal atherosclerosis of the distal aorta. No pathologic adenopathy within the abdomen or pelvis. Reproductive: Prostate is unremarkable. Other: Trace free fluid in the right upper quadrant. No free intraperitoneal gas. No fluid collection, pseudocyst, or abscess. Musculoskeletal: No acute or destructive bony lesions. Reconstructed images demonstrate no additional findings. IMPRESSION: 1. Mild inflammatory changes surrounding the head of the pancreas, consistent with acute uncomplicated pancreatitis. 2. Bilateral nonobstructing renal calculi. Electronically Signed   By: Randa Ngo M.D.   On: 01/31/2020 03:13        The results of significant diagnostics from this hospitalization (including imaging, microbiology, ancillary and laboratory) are listed below for reference.     Microbiology: Recent Results (from the past 240 hour(s))  SARS Coronavirus 2 by RT PCR (hospital order, performed in [facility] hospital lab) Nasopharyngeal Nasopharyngeal Swab     Status: None   Collection Time: 01/31/20  4:19 AM   Specimen: Nasopharyngeal Swab  Result Value Ref Range Status   SARS Coronavirus 2 NEGATIVE NEGATIVE Final    Comment: (NOTE) SARS-CoV-2 target nucleic acids are NOT DETECTED.  The SARS-CoV-2 RNA is generally detectable in upper and lower respiratory specimens  during the acute phase of infection. The lowest concentration of SARS-CoV-2 viral copies this assay can detect is 250 copies / mL. A negative result does not preclude SARS-CoV-2 infection and should not be used as the sole basis for treatment or other patient management decisions.  A negative result may occur with improper specimen collection / handling, submission of specimen other than nasopharyngeal swab, presence of viral mutation(s) within the areas targeted by this assay, and inadequate number of viral copies (<250 copies / mL). A negative result must be combined with clinical observations, patient history, and epidemiological information.  Fact Sheet for Patients:   StrictlyIdeas.no  Fact Sheet for Healthcare Providers: BankingDealers.co.za  This test is not yet approved or  cleared by the [place] FDA and has been authorized for detection and/or diagnosis of SARS-CoV-2 by FDA under an Emergency Use Authorization (EUA).  This EUA will remain in effect (meaning this test can be used) for the duration of the  COVID-19 declaration under Section 564(b)(1) of the Act, 21 U.S.C. section 360bbb-3(b)(1), unless the authorization is terminated or revoked sooner.  Performed at [facility], [place] [address], [place], [place] [address]   Surgical pcr screen     Status: None   Collection Time: 02/01/20  9:12 PM   Specimen: Nasal Mucosa; Nasal Swab  Result Value Ref Range Status   MRSA, PCR NEGATIVE NEGATIVE Final   Staphylococcus aureus NEGATIVE NEGATIVE Final    Comment: (NOTE) The Xpert SA Assay (FDA approved for NASAL specimens in patients 27 years of age and older), is one component of a comprehensive surveillance program. It is not intended to diagnose infection nor to guide or monitor treatment. Performed at [facility], [place] [address] [place], [place] [address]      Labs: BNP (last 3  results) No results for input(s): BNP in the last 8760 hours. Basic Metabolic Panel: Recent Labs  Lab 01/30/20 2345 01/31/20 0537 02/01/20 0522 02/02/20 0511 02/03/20 0529  NA 143 140 136 138 137  K 4.0 3.9 3.9 4.0 4.6  CL 101 108 104 106 105  CO2 31 26 26 24 25   GLUCOSE 109* 120* 76 81 131*  BUN 24* 24* 20 10 14   CREATININE 0.97 0.90 0.96 0.97 0.86  CALCIUM 9.8 8.7* 8.4* 8.5* 8.3*   Liver Function Tests: Recent Labs  Lab 01/30/20 2345 01/31/20 0537 02/01/20 0522 02/02/20 0511 02/03/20 0529  AST 106* 226* 86* 46* 27  ALT 59* 197* 162* 113* 86*  ALKPHOS 102 100 85 88 92  BILITOT 0.9 0.9 1.1 0.9 0.5  PROT 6.6 5.3* 5.3* 5.5* 5.8*  ALBUMIN 4.2 3.4* 3.1* 3.1* 3.1*   Recent Labs  Lab 01/30/20 2345 02/01/20 0522 02/02/20 0511 02/03/20 0529  LIPASE 2,235* 1,033* 173* 38   No results for input(s): AMMONIA in the last 168 hours. CBC: Recent Labs  Lab 01/30/20 2345 01/31/20 0537 02/01/20 0522 02/02/20 0511 02/03/20 0529  WBC 11.3* 11.4* 8.0 6.1 6.1  NEUTROABS  --  10.5* 6.1 4.0 5.0  HGB 15.7 13.2 12.1* 12.4* 12.8*  HCT 48.5 41.0 38.4* 39.2 40.0  MCV 92.4 93.0 94.1 94.2 92.4  PLT 234 152 118* 130* 151   Cardiac Enzymes: No results for input(s): CKTOTAL, CKMB, CKMBINDEX, TROPONINI in the last 168 hours. BNP: Invalid input(s): POCBNP CBG: Recent Labs  Lab 02/02/20 0011 02/02/20 0800 02/02/20 1654 02/02/20 2342 02/03/20 0735  GLUCAP 75 76 137* 144* 121*   D-Dimer No results for input(s): DDIMER in the last 72 hours. Hgb A1c No results for input(s): HGBA1C in the last 72 hours. Lipid Profile No results for input(s): CHOL, HDL, LDLCALC, TRIG, CHOLHDL, LDLDIRECT in the last 72 hours. Thyroid function studies No results for input(s): TSH, T4TOTAL, T3FREE, THYROIDAB in the last 72 hours.  Invalid input(s): FREET3 Anemia work up No results for input(s): VITAMINB12, FOLATE, FERRITIN, TIBC, IRON, RETICCTPCT in the last 72 hours. Urinalysis    Component  Value Date/Time   COLORURINE YELLOW 01/31/2020 0605   APPEARANCEUR CLEAR 01/31/2020 0605   LABSPEC 1.024 01/31/2020 0605   PHURINE 7.0 01/31/2020 0605   GLUCOSEU NEGATIVE 01/31/2020 0605   HGBUR NEGATIVE 01/31/2020 0605   BILIRUBINUR NEGATIVE 01/31/2020 0605   KETONESUR NEGATIVE 01/31/2020 0605   PROTEINUR NEGATIVE 01/31/2020 0605   NITRITE NEGATIVE 01/31/2020 0605   LEUKOCYTESUR NEGATIVE 01/31/2020 0605   Sepsis Labs Invalid input(s): PROCALCITONIN,  WBC,  LACTICIDVEN   Time coordinating discharge: 25 minutes  SIGNED:  Mercy Riding, MD  [facility] 02/03/2020, 8:07 AM  If 7PM-7AM, please contact night-coverage [email or web address]

## 2020-02-03 NOTE — Progress Notes (Signed)
MD made aware of HR of 45. Awaiting new orders.

## 2020-02-03 NOTE — Progress Notes (Signed)
[facility] Progress Note  1 Day Post-Op  Subjective: Patient reports some mild pain in RUQ. Tolerating diet. Asking about air travel in 2 weeks, discussed recommendations around this.   Objective: Vital signs in last 24 hours: Temp:  [97.7 F (36.5 C)-98.8 F (37.1 C)] 98.3 F (36.8 C) (09/02 0532) Pulse Rate:  [45-80] 47 (09/02 0555) Resp:  [12-18] 18 (09/02 0532) BP: (98-135)/(61-82) 114/78 (09/02 0532) SpO2:  [93 %-100 %] 99 % (09/02 0532) Weight:  [97.5 kg] 97.5 kg (09/01 1044) Last BM Date: 02/01/20  Intake/Output from previous day: 09/01 0701 - 09/02 0700 In: 4033.7 [P.O.:1800; I.V.:2183.7; IV Piggyback:50] Out: 20 [Blood:20] Intake/Output this shift: No intake/output data recorded.  PE: General: pleasant, WD, overweight male who is laying in bed in NAD Abd: soft, NT, ND, incisions c/d/i MS: all 4 extremities are symmetrical with no cyanosis, clubbing, or edema. Skin: warm and dry with no masses, lesions, or rashes Psych: A&Ox3 with an appropriate affect.   Lab Results:  Recent Labs    02/02/20 0511 02/03/20 0529  WBC 6.1 6.1  HGB 12.4* 12.8*  HCT 39.2 40.0  PLT 130* 151   BMET Recent Labs    02/02/20 0511 02/03/20 0529  NA 138 137  K 4.0 4.6  CL 106 105  CO2 24 25  GLUCOSE 81 131*  BUN 10 14  CREATININE 0.97 0.86  CALCIUM 8.5* 8.3*   PT/INR Recent Labs    02/02/20 0511 02/03/20 0529  LABPROT 14.2 13.1  INR 1.1 1.0   CMP     Component Value Date/Time   NA 137 02/03/2020 0529   K 4.6 02/03/2020 0529   CL 105 02/03/2020 0529   CO2 25 02/03/2020 0529   GLUCOSE 131 (H) 02/03/2020 0529   BUN 14 02/03/2020 0529   CREATININE 0.86 02/03/2020 0529   CALCIUM 8.3 (L) 02/03/2020 0529   PROT 5.8 (L) 02/03/2020 0529   ALBUMIN 3.1 (L) 02/03/2020 0529   AST 27 02/03/2020 0529   ALT 86 (H) 02/03/2020 0529   ALKPHOS 92 02/03/2020 0529   BILITOT 0.5 02/03/2020 0529   GFRNONAA >60 02/03/2020 0529   GFRAA >60 02/03/2020 0529    Lipase     Component Value Date/Time   LIPASE 38 02/03/2020 0529       Studies/Results: No results found.  Anti-infectives: Anti-infectives (From admission, onward)   Start     Dose/Rate Route Frequency Ordered Stop   02/02/20 0745  ceFAZolin (ANCEF) IVPB 2g/100 mL premix        2 g 200 mL/hr over 30 Minutes Intravenous  Once 02/02/20 0734         Assessment/Plan HTN GERD  Gallstone pancreatitis S/p laparoscopic cholecystectomy with [facility] 02/02/20 Dr. Kieth Brightly - POD#1 - tolerating diet and pain well controlled - ok to discharge from a surgical perspective - will send Rx for pain medication and put follow up in AVS  FEN: [facility]  VTE: lovenox ID: Ancef pre-op  LOS: 3 days    Norm Parcel , [facility] 02/03/2020, 7:57 AM Please see Amion for pager number during day hours 7:00am-4:30pm

## 2020-02-03 NOTE — Progress Notes (Signed)
Discharge were given to patient and all questions were answered.  Patient was walked to main exit.

## 2020-02-29 ENCOUNTER — Other Ambulatory Visit: Payer: Self-pay

## 2020-02-29 ENCOUNTER — Encounter (HOSPITAL_COMMUNITY): Payer: Self-pay | Admitting: Emergency Medicine

## 2020-02-29 ENCOUNTER — Emergency Department (HOSPITAL_COMMUNITY): Payer: 59

## 2020-02-29 ENCOUNTER — Emergency Department (HOSPITAL_COMMUNITY)
Admission: EM | Admit: 2020-02-29 | Discharge: 2020-03-01 | Disposition: A | Discharge status: Home / Self Care | Payer: 59 | Attending: Emergency Medicine | Admitting: Emergency Medicine

## 2020-02-29 DIAGNOSIS — M25512 Pain in left shoulder: Secondary | ICD-10-CM | POA: Insufficient documentation

## 2020-02-29 DIAGNOSIS — R079 Chest pain, unspecified: Secondary | ICD-10-CM | POA: Diagnosis not present

## 2020-02-29 DIAGNOSIS — Z5321 Procedure and treatment not carried out due to patient leaving prior to being seen by health care provider: Secondary | ICD-10-CM | POA: Diagnosis not present

## 2020-02-29 LAB — BASIC METABOLIC PANEL
Anion gap: 8 (ref 5–15)
BUN: 22 mg/dL — ABNORMAL HIGH (ref 6–20)
CO2: 28 mmol/L (ref 22–32)
Calcium: 9 mg/dL (ref 8.9–10.3)
Chloride: 103 mmol/L (ref 98–111)
Creatinine, Ser: 0.92 mg/dL (ref 0.61–1.24)
GFR calc Af Amer: >60 mL/min (ref >60)
GFR calc non Af Amer: >60 mL/min (ref >60)
Glucose, Bld: 105 mg/dL — ABNORMAL HIGH (ref 70–99)
Potassium: 3.9 mmol/L (ref 3.5–5.1)
Sodium: 139 mmol/L (ref 135–145)

## 2020-02-29 LAB — CBC
HCT: 46.1 % (ref 39.0–52.0)
Hemoglobin: 14.7 g/dL (ref 13.0–17.0)
MCH: 29.6 pg (ref 26.0–34.0)
MCHC: 31.9 g/dL (ref 30.0–36.0)
MCV: 92.9 fL (ref 80.0–100.0)
Platelets: 223 10*3/uL (ref 150–400)
RBC: 4.96 MIL/uL (ref 4.22–5.81)
RDW: 14 % (ref 11.5–15.5)
WBC: 8.7 10*3/uL (ref 4.0–10.5)
nRBC: 0 % (ref 0.0–0.2)

## 2020-02-29 LAB — TROPONIN I (HIGH SENSITIVITY): Troponin I (High Sensitivity): 3 ng/L (ref <18)

## 2020-02-29 NOTE — ED Triage Notes (Signed)
Patient is having pressure of left side of chest and a sharp pain on left shoulder blade. Patient states he has not did anything to hurt him self.

## 2020-03-01 ENCOUNTER — Telehealth: Payer: Self-pay | Admitting: Internal Medicine

## 2020-03-01 NOTE — ED Notes (Signed)
Called 3X. Eloped from waiting area. VS stable.

## 2020-03-01 NOTE — Telephone Encounter (Signed)
lvm for patient to return call to get appointment scheduled with [facility] from message received through [facility]

## 2020-03-08 NOTE — Progress Notes (Signed)
Cardiology Office Note   Date:  03/09/2020   ID:  Glennon Kopko, DOB 1965/10/02, MRN [id number]  PCP:  Deland Pretty, MD    No chief complaint on file.  Coronary calcium  Wt Readings from Last 3 Encounters:  03/09/20 222 lb 9.6 oz (101 kg)  02/02/20 215 lb (97.5 kg)  11/25/19 231 lb (104.8 kg)       History of Present Illness: Kaylum Shrum is a 54 y.o. male  Who has a family h/oo CAD.  Father had a stroke and MI in 74s. Brothers have had heart disease. Both older brothers had stents, age 62.  Stress test in 2014 was negative.   Calcium scoring CT showed: "Coronary calcium score of 52 Agatston units. This was 68th percentile for age and sex matched control, suggesting intermediate risk for future cardiac events."  Ascending aortic aneurysm 4.5 cm.  He had his gall bladder removed in 2021.  He lost weight through diet and exercise; weight loss with stress from cancer scare and separation from his wife.    He does some light weight lifting.  Knee pain limits jogging.  Back pain as well.   Made one trip to the ER for chest pain.  Negative w/u.     Past Medical History:  Diagnosis Date  . Complication of anesthesia    rash afterwords"  . Diverticulitis   . GERD (gastroesophageal reflux disease)   . Hypertension   . Reflux     Past Surgical History:  Procedure Laterality Date  . BACK SURGERY    . BACK SURGERY    . CHOLECYSTECTOMY N/A 02/02/2020   Procedure: LAPAROSCOPIC CHOLECYSTECTOMY WITH INTRAOPERATIVE CHOLANGIOGRAM;  Surgeon: Kieth Brightly Arta Bruce, MD;  Location: [facility] ORS;  Service: General;  Laterality: N/A;  . COLON SURGERY    . COLON SURGERY    . KNEE SURGERY    . KNEE SURGERY    . SHOULDER SURGERY       Current Outpatient Medications  Medication Sig Dispense Refill  . acetaminophen (TYLENOL) 500 MG tablet Take by mouth as needed.     . Cholecalciferol 50 MCG (2000 UT) CAPS Take by mouth daily.     Marland Kitchen escitalopram (LEXAPRO) 10 MG tablet  Take 10 mg by mouth daily.    . irbesartan (AVAPRO) 300 MG tablet Take 300 mg by mouth daily.    Marland Kitchen loratadine (CLARITIN) 10 MG tablet Take 1 tablet by mouth daily as needed for allergies.    . naproxen sodium (ALEVE) 220 MG tablet Take 220 mg by mouth as needed.    . zolpidem (AMBIEN) 10 MG tablet Take 10 mg by mouth at bedtime as needed for sleep.     No current facility-administered medications for this visit.    Allergies:   Sulfamethoxazole    Social History:  The patient  reports that he has never smoked. He has never used smokeless tobacco. He reports current alcohol use. He reports that he does not use drugs.   Family History:  The patient's family history includes Breast cancer in his mother; Deep vein thrombosis in his mother; Heart disease (age of onset: 24) in his father; Heart disease (age of onset: 63) in his brother; Rectal cancer in his mother; Stroke in his father.    ROS:  Please see the history of present illness.   Otherwise, review of systems are positive for stress.   All other systems are reviewed and negative.    PHYSICAL EXAM: VS:  BP  120/76   Pulse 73   Ht 5\' 11"  (1.803 m)   Wt 222 lb 9.6 oz (101 kg)   SpO2 97%   BMI 31.05 kg/m  , BMI Body mass index is 31.05 kg/m. GEN: Well nourished, well developed, in no acute distress  HEENT: normal  Neck: no JVD, carotid bruits, or masses Cardiac: RRR; no murmurs, rubs, or gallops,no edema  Respiratory:  clear to auscultation bilaterally, normal work of breathing GI: soft, nontender, nondistended, + BS MS: no deformity or atrophy  Skin: warm and dry, no rash Neuro:  Strength and sensation are intact Psych: euthymic mood, full affect   EKG:   The ekg ordered last week demonstrates normal ECG   Recent Labs: 02/03/2020: ALT 86 02/29/2020: BUN 22; Creatinine, Ser 0.92; Hemoglobin 14.7; Platelets 223; Potassium 3.9; Sodium 139   Lipid Panel    Component Value Date/Time   CHOL 143 06/20/2011 0632   TRIG 52  01/31/2020 0313   HDL 34 (L) 06/20/2011 0632   CHOLHDL 4.2 06/20/2011 0632   VLDL 21 06/20/2011 0632   LDLCALC 88 06/20/2011 7353     Other studies Reviewed: Additional studies/ records that were reviewed today with results demonstrating: troponin 3 in 9/21.   ASSESSMENT AND PLAN:  1. Coronary calcium: Start Crestor 10 mg daily.  Given chest pain that prompted ER episode, will plan CTA coronaries.   2. HTN: Controlled on irbesartan 300 mg daily. Low salt diet. 3. Family h/o CAD: We spoke about adding a statin for prevention. Will start with Crestor 10 mg daily.  4. Dilated aorta: 4.5 cm.  Serial CT scans.  Avoid straining.  He only does light weights.   Current medicines are reviewed at length with the patient today.  The patient concerns regarding his medicines were addressed.  The following changes have been made:    Labs/ tests ordered today include:  No orders of the defined types were placed in this encounter.   Recommend 150 minutes/week of aerobic exercise Low fat, low carb, high fiber diet recommended  Disposition:   FU in 1 year, or sooner if the CT has significant abnormalities   Signed, Larae Grooms, MD  03/09/2020 4:06 PM    [facility] [place], [place], [place]  [address] Phone: [phone]; Fax: [phone]

## 2020-03-09 ENCOUNTER — Ambulatory Visit (INDEPENDENT_AMBULATORY_CARE_PROVIDER_SITE_OTHER): Payer: 59 | Admitting: Interventional Cardiology

## 2020-03-09 ENCOUNTER — Other Ambulatory Visit: Payer: Self-pay

## 2020-03-09 ENCOUNTER — Encounter: Payer: Self-pay | Admitting: Interventional Cardiology

## 2020-03-09 VITALS — BP 120/76 | HR 73 | Ht 71.0 in | Wt 222.6 lb

## 2020-03-09 DIAGNOSIS — Z8249 Family history of ischemic heart disease and other diseases of the circulatory system: Secondary | ICD-10-CM

## 2020-03-09 DIAGNOSIS — I10 Essential (primary) hypertension: Secondary | ICD-10-CM

## 2020-03-09 DIAGNOSIS — R072 Precordial pain: Secondary | ICD-10-CM | POA: Diagnosis not present

## 2020-03-09 DIAGNOSIS — R931 Abnormal findings on diagnostic imaging of heart and coronary circulation: Secondary | ICD-10-CM

## 2020-03-09 MED ORDER — ROSUVASTATIN CALCIUM 10 MG PO TABS: 10.0000 mg | ORAL_TABLET | Freq: Every day | ORAL | 3 refills | Status: DC

## 2020-03-09 MED ORDER — METOPROLOL TARTRATE 100 MG PO TABS: ORAL_TABLET | ORAL | 0 refills | Status: DC

## 2020-03-09 NOTE — Patient Instructions (Addendum)
Medication Instructions:  Your physician has recommended you make the following change in your medication:   START: rosuvastatin (crestor) 10 mg tablet: Take 1 tablet by mouth once a day  *If you need a refill on your cardiac medications before your next appointment, please call your pharmacy*   Lab Work: Your physician recommends that you return for a FASTING lipid profile and liver function panel in 2-3 months  If you have labs (blood work) drawn today and your tests are completely normal, you will receive your results only by: Marland Kitchen MyChart Message (if you have MyChart) OR . A paper copy in the mail If you have any lab test that is abnormal or we need to change your treatment, we will call you to review the results.   Testing/Procedures: Your physician has requested that you have cardiac CT.  Follow-Up: At [facility], you and your health needs are our priority.  As part of our continuing mission to provide you with exceptional heart care, we have created designated Provider Care Teams.  These Care Teams include your primary Cardiologist (physician) and Advanced Practice Providers (APPs -  Physician Assistants and Nurse Practitioners) who all work together to provide you with the care you need, when you need it.  We recommend signing up for the patient portal called "MyChart".  Sign up information is provided on this After Visit Summary.  MyChart is used to connect with patients for Virtual Visits (Telemedicine).  Patients are able to view lab/test results, encounter notes, upcoming appointments, etc.  Non-urgent messages can be sent to your provider as well.   To learn more about what you can do with MyChart, go to [email or web address].    Your next appointment:   12 month(s)  The format for your next appointment:   In Person  Provider:   You may see Casandra Doffing, MD or one of the following Advanced Practice Providers on your designated Care Team:    Melina Copa,  PA-C  Ermalinda Barrios, PA-C    Other Instructions Your cardiac CT will be scheduled at one of the below locations:   [facility] [address] [place], [place] [address] [phone]  [place] [address] [place], [place] [address] [phone]  If scheduled at [facility], please arrive at the [facility] main entrance of [facility] 30 minutes prior to test start time. Proceed to the [facility] Radiology Department (first floor) to check-in and test prep.  If scheduled at [facility], please arrive 15 mins early for check-in and test prep.  Please follow these instructions carefully (unless otherwise directed):  Hold all erectile dysfunction medications at least 3 days (72 hrs) prior to test.  On the Night Before the Test: . Be sure to Drink plenty of water. . Do not consume any caffeinated/decaffeinated beverages or chocolate 12 hours prior to your test. . Do not take any antihistamines 12 hours prior to your test.   On the Day of the Test: . Drink plenty of water. Do not drink any water within one hour of the test. . Do not eat any food 4 hours prior to the test. . You may take your regular medications prior to the test.  . Take metoprolol (Lopressor) 100 MG two hours prior to test.      After the Test: . Drink plenty of water. . After receiving IV contrast, you may experience a mild flushed feeling.  This is normal. . On occasion, you may experience a mild rash up to 24 hours after the test. This is not dangerous. If this occurs, you can take Benadryl 25 mg and increase your fluid intake. . If you experience trouble breathing, this can be serious. If it is severe call 911 IMMEDIATELY. If it is mild, please call our office.   Once we have confirmed authorization from your insurance company, we will call you to set up a date and time for your test. Based on  how quickly your insurance processes prior authorizations requests, please allow up to 4 weeks to be contacted for scheduling your Cardiac CT appointment. Be advised that routine Cardiac CT appointments could be scheduled as many as 8 weeks after your provider has ordered it.  For non-scheduling related questions, please contact the cardiac imaging nurse navigator should you have any questions/concerns: Marchia Bond, Cardiac Imaging Nurse Navigator Burley Saver, Interim Cardiac Imaging Nurse [facility] Direct Office Dial: [phone]   For scheduling needs, including cancellations and rescheduling, please call Vivien Rota at [phone], option 3.

## 2020-03-31 ENCOUNTER — Telehealth (HOSPITAL_COMMUNITY): Payer: Self-pay | Admitting: Emergency Medicine

## 2020-03-31 NOTE — Telephone Encounter (Signed)
Attempted to call patient regarding upcoming cardiac CT appointment. °Left message on voicemail with name and callback number °Shernita Rabinovich RN Navigator Cardiac Imaging °[facility] °[phone] Office °[phone] Cell ° °

## 2020-04-03 ENCOUNTER — Other Ambulatory Visit: Payer: Self-pay

## 2020-04-03 ENCOUNTER — Ambulatory Visit (HOSPITAL_COMMUNITY)
Admission: RE | Admit: 2020-04-03 | Discharge: 2020-04-03 | Disposition: A | Discharge status: Home / Self Care | Payer: 59 | Source: Ambulatory Visit | Attending: Interventional Cardiology | Admitting: Interventional Cardiology

## 2020-04-03 ENCOUNTER — Encounter: Payer: Self-pay | Admitting: *Deleted

## 2020-04-03 DIAGNOSIS — Z006 Encounter for examination for normal comparison and control in clinical research program: Secondary | ICD-10-CM

## 2020-04-03 DIAGNOSIS — R072 Precordial pain: Secondary | ICD-10-CM | POA: Diagnosis present

## 2020-04-03 MED ORDER — NITROGLYCERIN 0.4 MG SL SUBL
SUBLINGUAL_TABLET | SUBLINGUAL | Status: AC
  Filled 2020-04-03: qty 2

## 2020-04-03 MED ORDER — NITROGLYCERIN 0.4 MG SL SUBL
0.8000 mg | SUBLINGUAL_TABLET | Freq: Once | SUBLINGUAL | Status: AC
  Administered 2020-04-03: 0.8 mg via SUBLINGUAL

## 2020-04-03 MED ORDER — IOHEXOL 350 MG/ML SOLN
80.0000 mL | Freq: Once | INTRAVENOUS | Status: AC | PRN
  Administered 2020-04-03: 80 mL via INTRAVENOUS

## 2020-04-03 NOTE — Research (Signed)
IDENTIFY  Informed Consent                  Subject Name:   Andrew Hooper   Subject met inclusion and exclusion criteria.  The informed consent form, study requirements and expectations were reviewed with the subject and questions and concerns were addressed prior to the signing of the consent form.  The subject verbalized understanding of the trial requirements.  The subject agreed to participate in the IDENTIFY  trial and signed the informed consent.  The informed consent was obtained prior to performance of any protocol-specific procedures for the subject.  A copy of the signed informed consent was given to the subject and a copy was placed in the subject's medical record.   [place] Elgar Scoggins, Research Assistant  04-03-2020 15:25

## 2020-04-04 DIAGNOSIS — R072 Precordial pain: Secondary | ICD-10-CM | POA: Diagnosis not present

## 2020-04-06 ENCOUNTER — Telehealth: Payer: Self-pay | Admitting: *Deleted

## 2020-04-06 ENCOUNTER — Other Ambulatory Visit: Payer: Self-pay | Admitting: *Deleted

## 2020-04-06 DIAGNOSIS — I712 Thoracic aortic aneurysm, without rupture, unspecified: Secondary | ICD-10-CM

## 2020-04-06 NOTE — Telephone Encounter (Signed)
     I went in pt's chart to see who called. I transferred the call to [facility]

## 2020-04-06 NOTE — Telephone Encounter (Signed)
I spoke with patient and reviewed cardiac CT results with him.  Order placed for CTA to be done in one year

## 2020-05-10 ENCOUNTER — Other Ambulatory Visit: Payer: 59 | Admitting: *Deleted

## 2020-05-10 ENCOUNTER — Other Ambulatory Visit: Payer: Self-pay

## 2020-05-10 DIAGNOSIS — R931 Abnormal findings on diagnostic imaging of heart and coronary circulation: Secondary | ICD-10-CM

## 2020-05-10 DIAGNOSIS — R072 Precordial pain: Secondary | ICD-10-CM

## 2020-05-10 DIAGNOSIS — I10 Essential (primary) hypertension: Secondary | ICD-10-CM

## 2020-05-10 DIAGNOSIS — Z8249 Family history of ischemic heart disease and other diseases of the circulatory system: Secondary | ICD-10-CM

## 2020-05-10 LAB — HEPATIC FUNCTION PANEL
ALT: 16 IU/L (ref 0–44)
AST: 15 IU/L (ref 0–40)
Albumin: 4.5 g/dL (ref 3.8–4.9)
Alkaline Phosphatase: 90 IU/L (ref 44–121)
Bilirubin Total: 0.5 mg/dL (ref 0.0–1.2)
Bilirubin, Direct: 0.16 mg/dL (ref 0.00–0.40)
Total Protein: 6.9 g/dL (ref 6.0–8.5)

## 2020-05-10 LAB — LIPID PANEL
Chol/HDL Ratio: 2.8 ratio (ref 0.0–5.0)
Cholesterol, Total: 116 mg/dL (ref 100–199)
HDL: 42 mg/dL (ref >39)
LDL Chol Calc (NIH): 59 mg/dL (ref 0–99)
Triglycerides: 74 mg/dL (ref 0–149)
VLDL Cholesterol Cal: 15 mg/dL (ref 5–40)

## 2020-08-17 ENCOUNTER — Telehealth: Payer: Self-pay | Admitting: *Deleted

## 2020-08-17 DIAGNOSIS — Z006 Encounter for examination for normal comparison and control in clinical research program: Secondary | ICD-10-CM

## 2020-08-17 NOTE — Telephone Encounter (Signed)
I called patient for 90-day Identify Study phone call. Patient is doing well with no cardiac symptoms. I reminded patient I would call him in Nov. for 1 year phone call.

## 2021-03-11 ENCOUNTER — Other Ambulatory Visit: Payer: Self-pay | Admitting: Interventional Cardiology

## 2021-03-12 IMAGING — CT CT ABD-PELV W/ CM
2 of 5 series · 16 of 46 positions shown, 18 images · IV contrast (omnipaque)
Comparison: None.

CLINICAL DATA: Sudden onset upper abdominal pain

EXAM:
CT ABDOMEN AND PELVIS WITH CONTRAST
TECHNIQUE: Multidetector CT imaging of the abdomen and pelvis was performed
using the standard protocol following bolus administration of
intravenous contrast.
CONTRAST:  100mL OMNIPAQUE IOHEXOL 300 MG/ML  SOLN

[Series 2: axial st · axial · 0.83mm/px · z∈[-542,-77]mm · 13 of 109 slices shown, 15 images]
[im 8/109  soft-tissue]
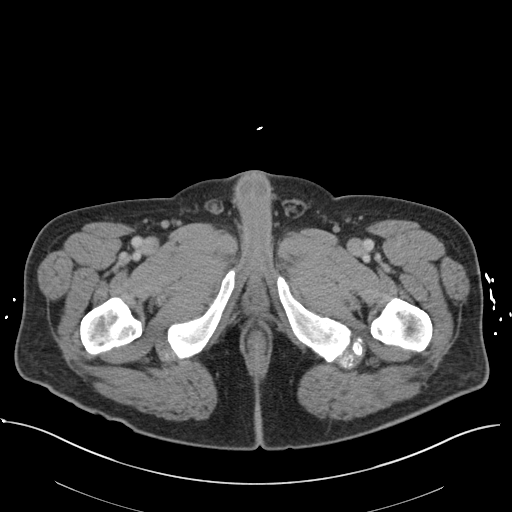
[im 8/109  bone]
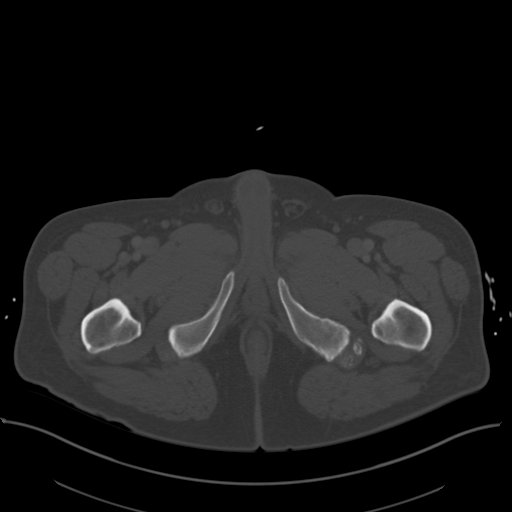
[im 16/109  soft-tissue]
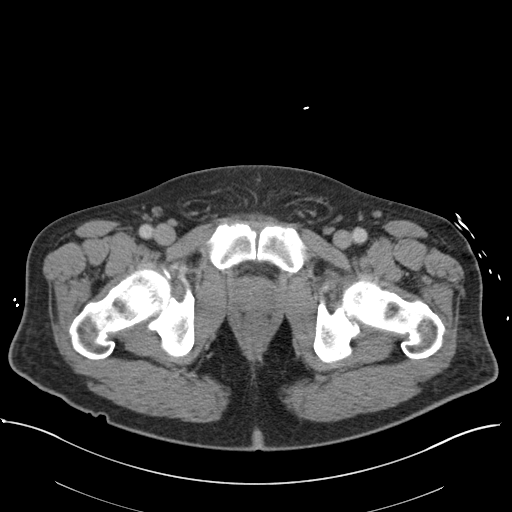
[im 24/109  soft-tissue]
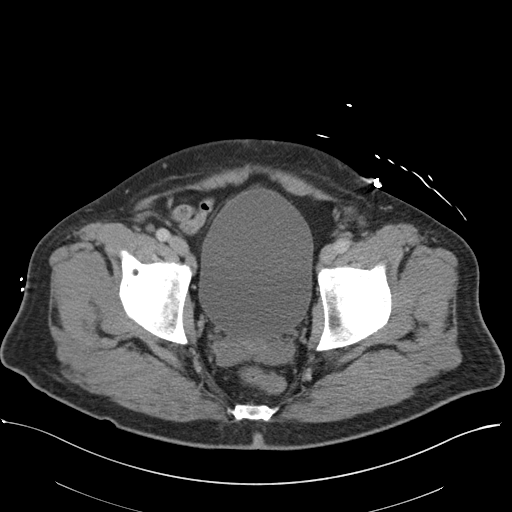
[im 31/109  soft-tissue]
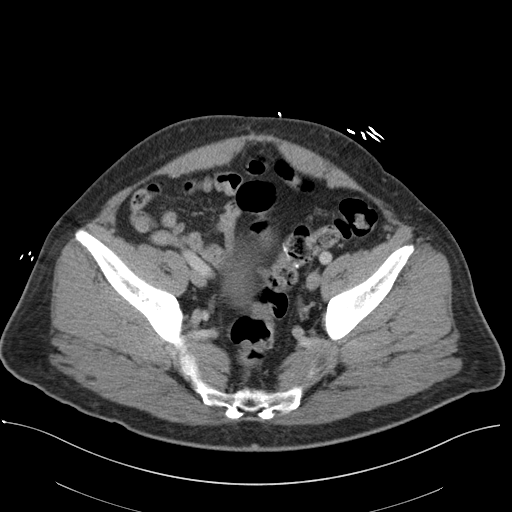
[im 39/109  soft-tissue]
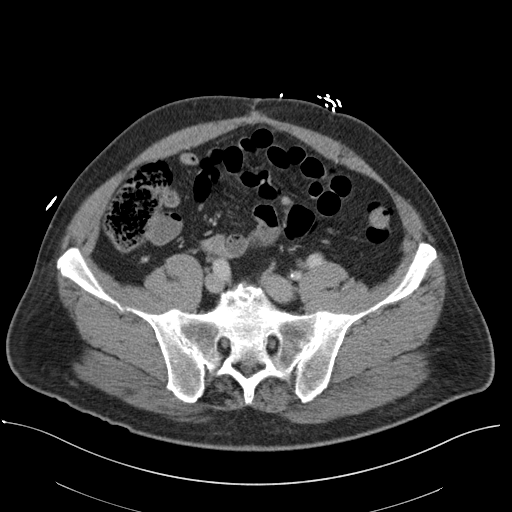
[im 47/109  soft-tissue]
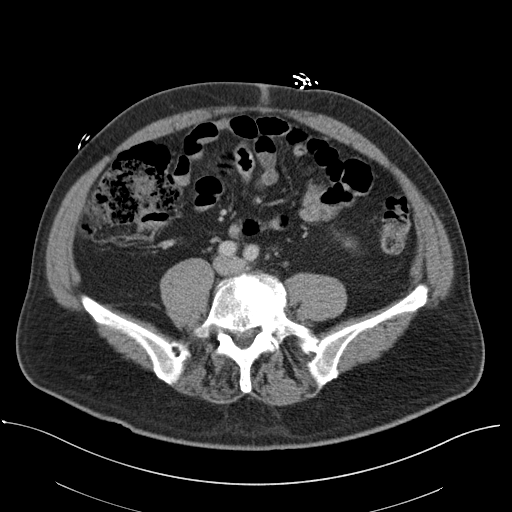
[im 55/109  soft-tissue]
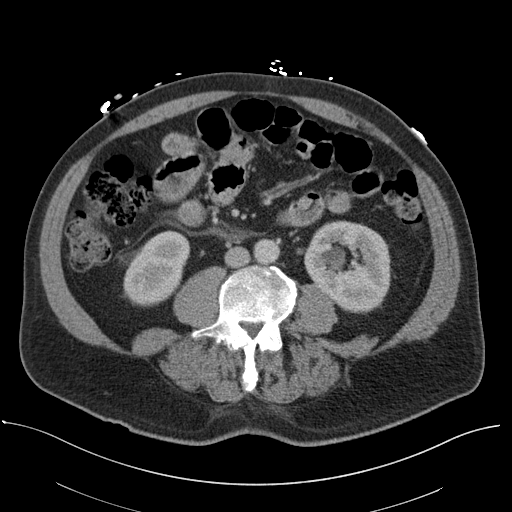
[im 62/109  soft-tissue]
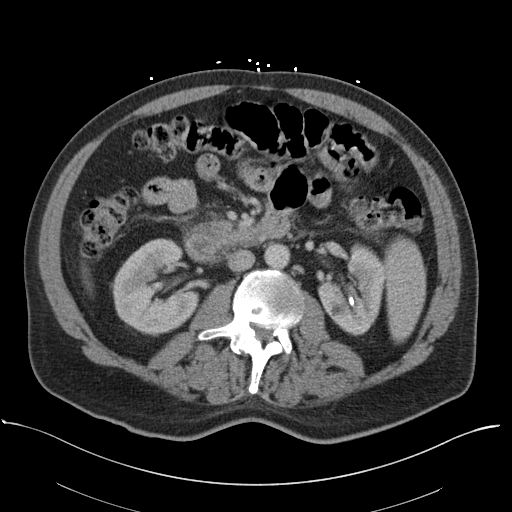
[im 70/109  soft-tissue]
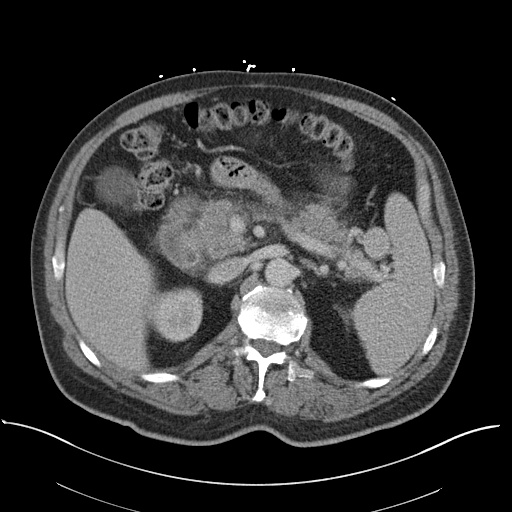
[im 70/109  bone]
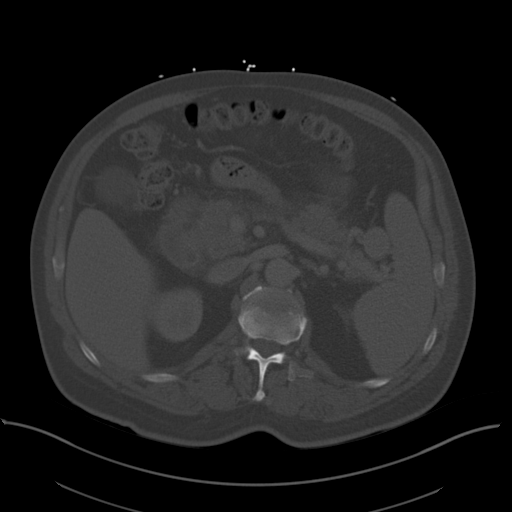
[im 78/109  soft-tissue]
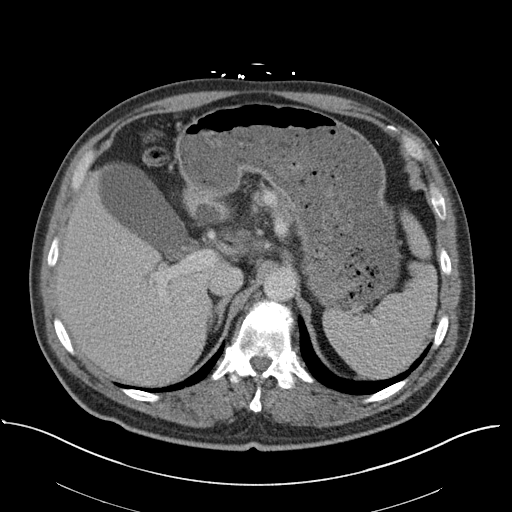
[im 85/109  soft-tissue]
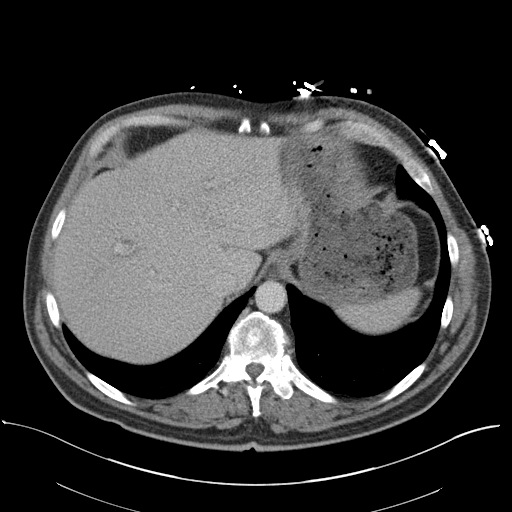
[im 93/109  soft-tissue]
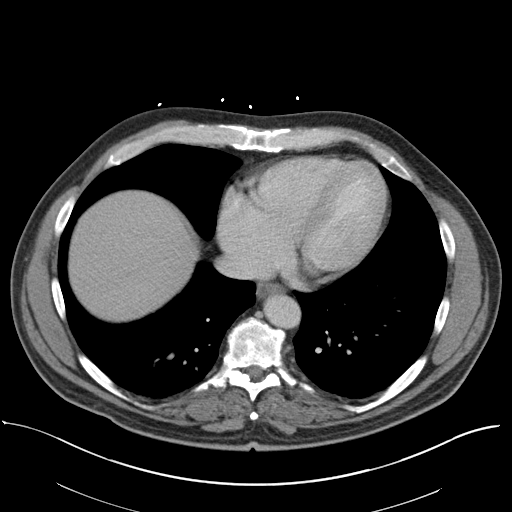
[im 101/109  soft-tissue]
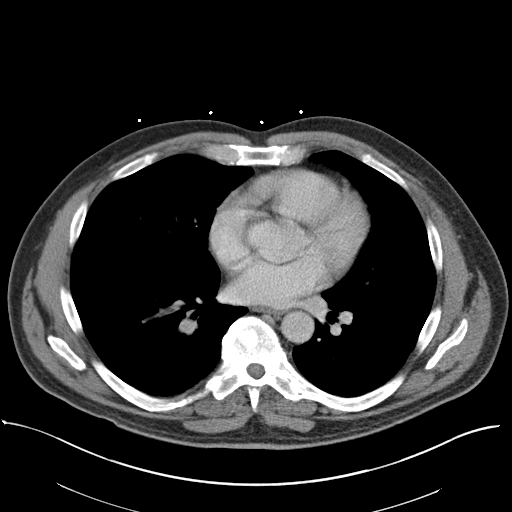

[Series 5: coronal st · coronal · 0.87mm/px · 3 of 166 slices shown]
[im 56/166  soft-tissue]
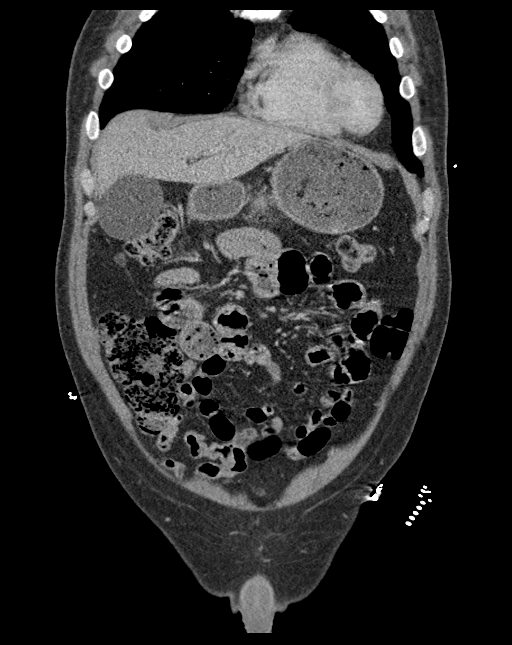
[im 74/166  soft-tissue]
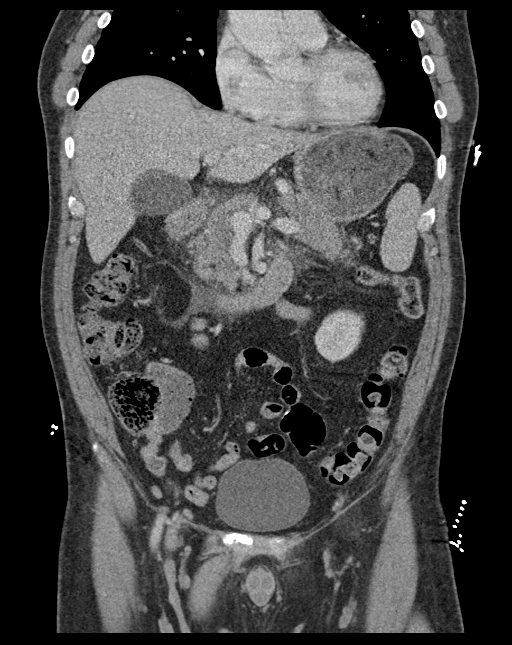
[im 92/166  soft-tissue]
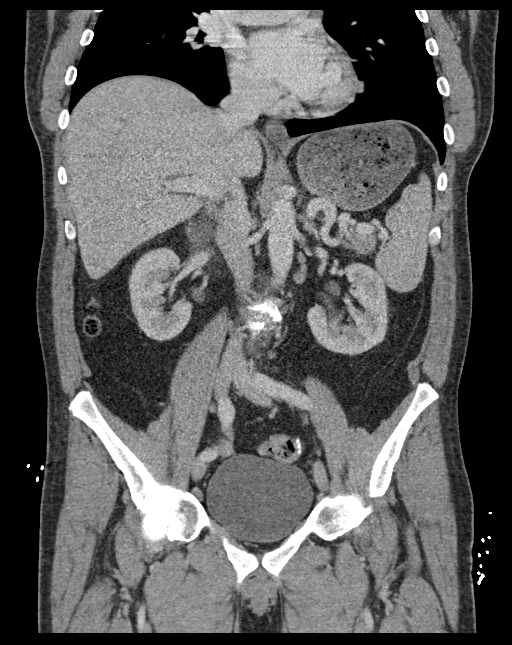

[16 of 46 positions shown; findings below may reference images not displayed]

FINDINGS: Lower chest: No acute pleural or parenchymal lung disease.

Hepatobiliary: No focal liver abnormality is seen. No gallstones,
gallbladder wall thickening, or biliary dilatation.

Pancreas: There is fat stranding surrounding the head of the
pancreas consistent with acute uncomplicated pancreatitis. The
pancreatic parenchyma enhances normally.

Spleen: Normal in size without focal abnormality.

Adrenals/Urinary Tract: There are bilateral nonobstructing renal
calculi, largest on the right measuring 7 mm and on the left
measuring 9 mm. No obstructive uropathy within either kidney. The
ureters and bladder are grossly normal. Adrenals are unremarkable.

Stomach/Bowel: No bowel obstruction or ileus. Normal appendix right
lower quadrant. Postsurgical changes from prior sigmoid colon
resection and reanastomosis. No bowel wall thickening. Fat stranding
surrounding the duodenum likely related to the pancreatitis
described above.

Vascular/Lymphatic: There is minimal atherosclerosis of the distal
aorta.

No pathologic adenopathy within the abdomen or pelvis.

Reproductive: Prostate is unremarkable.

Other: Trace free fluid in the right upper quadrant. No free
intraperitoneal gas. No fluid collection, pseudocyst, or abscess.

Musculoskeletal: No acute or destructive bony lesions. Reconstructed
images demonstrate no additional findings.
IMPRESSION: 1. Mild inflammatory changes surrounding the head of the pancreas,
consistent with acute uncomplicated pancreatitis.
2. Bilateral nonobstructing renal calculi.

## 2021-03-28 ENCOUNTER — Telehealth: Payer: Self-pay

## 2021-03-28 DIAGNOSIS — Z01812 Encounter for preprocedural laboratory examination: Secondary | ICD-10-CM

## 2021-03-28 DIAGNOSIS — I712 Thoracic aortic aneurysm, without rupture, unspecified: Secondary | ICD-10-CM

## 2021-03-28 NOTE — Telephone Encounter (Addendum)
Patient needs BMET before CT. Ordered placed, CT to schedule.

## 2021-04-02 ENCOUNTER — Other Ambulatory Visit: Payer: 59 | Admitting: *Deleted

## 2021-04-02 ENCOUNTER — Other Ambulatory Visit: Payer: Self-pay

## 2021-04-02 DIAGNOSIS — I712 Thoracic aortic aneurysm, without rupture, unspecified: Secondary | ICD-10-CM

## 2021-04-02 DIAGNOSIS — Z01812 Encounter for preprocedural laboratory examination: Secondary | ICD-10-CM

## 2021-04-02 LAB — BASIC METABOLIC PANEL
BUN/Creatinine Ratio: 28 — ABNORMAL HIGH (ref 9–20)
BUN: 28 mg/dL — ABNORMAL HIGH (ref 6–24)
CO2: 26 mmol/L (ref 20–29)
Calcium: 9.2 mg/dL (ref 8.7–10.2)
Chloride: 104 mmol/L (ref 96–106)
Creatinine, Ser: 1 mg/dL (ref 0.76–1.27)
Glucose: 94 mg/dL (ref 70–99)
Potassium: 4.4 mmol/L (ref 3.5–5.2)
Sodium: 141 mmol/L (ref 134–144)
eGFR: 88 mL/min/{1.73_m2} (ref >59)

## 2021-04-04 ENCOUNTER — Other Ambulatory Visit: Payer: Self-pay

## 2021-04-04 ENCOUNTER — Ambulatory Visit (INDEPENDENT_AMBULATORY_CARE_PROVIDER_SITE_OTHER)
Admission: RE | Admit: 2021-04-04 | Discharge: 2021-04-04 | Disposition: A | Discharge status: Home / Self Care | Payer: 59 | Source: Ambulatory Visit | Attending: Interventional Cardiology | Admitting: Interventional Cardiology

## 2021-04-04 DIAGNOSIS — I7121 Aneurysm of the ascending aorta, without rupture: Secondary | ICD-10-CM

## 2021-04-04 DIAGNOSIS — I712 Thoracic aortic aneurysm, without rupture, unspecified: Secondary | ICD-10-CM | POA: Diagnosis not present

## 2021-04-04 MED ORDER — IOHEXOL 350 MG/ML SOLN
100.0000 mL | Freq: Once | INTRAVENOUS | Status: AC | PRN
  Administered 2021-04-04: 100 mL via INTRAVENOUS

## 2021-04-10 ENCOUNTER — Other Ambulatory Visit: Payer: Self-pay | Admitting: *Deleted

## 2021-04-10 ENCOUNTER — Other Ambulatory Visit: Payer: Self-pay | Admitting: Interventional Cardiology

## 2021-04-10 DIAGNOSIS — I7121 Aneurysm of the ascending aorta, without rupture: Secondary | ICD-10-CM

## 2021-06-20 ENCOUNTER — Ambulatory Visit: Payer: 59

## 2021-06-20 ENCOUNTER — Ambulatory Visit (INDEPENDENT_AMBULATORY_CARE_PROVIDER_SITE_OTHER): Payer: 59 | Admitting: Podiatry

## 2021-06-20 ENCOUNTER — Other Ambulatory Visit: Payer: Self-pay

## 2021-06-20 ENCOUNTER — Encounter: Payer: Self-pay | Admitting: Podiatry

## 2021-06-20 ENCOUNTER — Ambulatory Visit (INDEPENDENT_AMBULATORY_CARE_PROVIDER_SITE_OTHER): Payer: 59

## 2021-06-20 DIAGNOSIS — G5761 Lesion of plantar nerve, right lower limb: Secondary | ICD-10-CM | POA: Diagnosis not present

## 2021-06-20 DIAGNOSIS — D3610 Benign neoplasm of peripheral nerves and autonomic nervous system, unspecified: Secondary | ICD-10-CM

## 2021-06-20 DIAGNOSIS — M79671 Pain in right foot: Secondary | ICD-10-CM

## 2021-06-20 DIAGNOSIS — Q666 Other congenital valgus deformities of feet: Secondary | ICD-10-CM | POA: Diagnosis not present

## 2021-06-20 NOTE — Progress Notes (Signed)
Subjective:  Patient ID: Andrew Hooper, male    DOB: Apr 29, 1966,  MRN: [id number]  Chief Complaint  Patient presents with   Foot Pain    PT states he has pain on right ball of foot. Pt states he has had this pain before christmas and it feels like he has a pebble in his foot.    56 y.o. male presents with the above complaint.  Patient presents with complaint of right second interspace neuroma.  Patient states it feels a walking on pebbles.  It progressively gets worse as he been on his feet a lot.  Hurts more with going barefooted.  He states his gait pattern is changing because of the pain.  He has not seen and was prior to seeing me.  He has tried some inserts over-the-counter options none of which has helped.  At all.  He states it got worse over the last 3 weeks.   Review of Systems: Negative except as noted in the HPI. Denies N/V/F/Ch.  Past Medical History:  Diagnosis Date   Complication of anesthesia    rash afterwords"   Diverticulitis    GERD (gastroesophageal reflux disease)    Hypertension    Reflux     Current Outpatient Medications:    acetaminophen (TYLENOL) 500 MG tablet, Take by mouth as needed. , Disp: , Rfl:    Cholecalciferol 50 MCG (2000 UT) CAPS, Take by mouth daily. , Disp: , Rfl:    escitalopram (LEXAPRO) 10 MG tablet, Take 10 mg by mouth daily., Disp: , Rfl:    irbesartan (AVAPRO) 300 MG tablet, Take 300 mg by mouth daily., Disp: , Rfl:    loratadine (CLARITIN) 10 MG tablet, Take 1 tablet by mouth daily as needed for allergies., Disp: , Rfl:    metoprolol tartrate (LOPRESSOR) 100 MG tablet, Take 1 tablet by mouth 2 hours prior to Cardiac CT, Disp: 1 tablet, Rfl: 0   naproxen sodium (ALEVE) 220 MG tablet, Take 220 mg by mouth as needed., Disp: , Rfl:    rosuvastatin (CRESTOR) 10 MG tablet, TAKE 1 TABLET(10 MG) BY MOUTH DAILY, Disp: 30 tablet, Rfl: 11   zolpidem (AMBIEN) 10 MG tablet, Take 10 mg by mouth at bedtime as needed for sleep., Disp: , Rfl:    Social History   Tobacco Use  Smoking Status Never  Smokeless Tobacco Never    Allergies  Allergen Reactions   Sulfamethoxazole Rash    Sulfa based antibiotics    Objective:  There were no vitals filed for this visit. There is no height or weight on file to calculate BMI. Constitutional Well developed. Well nourished.  Vascular Dorsalis pedis pulses palpable bilaterally. Posterior tibial pulses palpable bilaterally. Capillary refill normal to all digits.  No cyanosis or clubbing noted. Pedal hair growth normal.  Neurologic Normal speech. Oriented to person, place, and time. Epicritic sensation to light touch grossly present bilaterally.  Dermatologic Nails well groomed and normal in appearance. No open wounds. No skin lesions.  Orthopedic: Pain on palpation to the right second or space.  Positive Mulder's click noted.  Radiographically motorists [place] sign noted.  No other bony abnormalities identified no other acute deformities noted.   Radiographs: 3 views of skeletally mature the right foot: There is a decrease in intermetatarsal space between the second and third and normal intermetatarsal space between first and third fourth.  [place] sign noted. Assessment:   1. Neuroma   2. Pes planovalgus    Plan:  Patient was evaluated and treated  and all questions answered.  Right second interspace neuroma -All questions and concerns were discussed with the patient in extensive detail. -He will also benefit from orthotics with metatarsal pad to take the pressure off of the submetatarsal 5.  For now we will focus on conservative treatment care.  Patient states understand would appreciate this. -He will benefit from steroid injection to help decrease acute inflammatory component associate with pain and around the nerve.  If this calms it down we will discuss treatment options with alcohol injection versus surgical excision.  He states understanding  Pes planovalgus -I  explained to patient the etiology of pes planovalgus and relationship with neuroma neuro and various treatment options were discussed.  Given patient foot structure in the setting of Planter fasciitis I believe patient will benefit from custom-made orthotics to help control the hindfoot motion support the arch of the foot and take the stress away from neuroma patient agrees with the plan like to proceed with orthotics -Patient was casted for orthotics   No follow-ups on file.

## 2021-06-20 NOTE — Progress Notes (Signed)
SITUATION Reason for Consult: Evaluation for Bilateral Custom Foot Orthoses Patient / Caregiver Report: Patient would like foot orthotics  OBJECTIVE DATA: Patient History / Diagnosis:    ICD-10-CM   1. Right foot pain  M79.671       Current or Previous Devices: None and no history  Foot Examination: Skin presentation:   Intact Ulcers & Callousing:   None and no history Toe / Foot Deformities:  Prominent met heads Weight Bearing Presentation:  rectus Sensation:    Intact  ORTHOTIC RECOMMENDATION Recommended Device: 1x pair of custom functional foot orthotics  GOALS OF ORTHOSES - Reduce Pain - Prevent Foot Deformity - Prevent Progression of Further Foot Deformity - Relieve Pressure - Improve the Overall Biomechanical Function of the Foot and Lower Extremity.  ACTIONS PERFORMED Patient was casted for Foot Orthoses via crush box. Procedure was explained and patient tolerated procedure well. All questions were answered and concerns addressed.  PLAN Potential out of pocket cost was communicated to patient. Casts are to be sent to [facility] for fabrication. Patient is to be called for fitting when devices are ready.

## 2021-06-22 ENCOUNTER — Other Ambulatory Visit: Payer: Self-pay | Admitting: Podiatry

## 2021-06-22 DIAGNOSIS — D3610 Benign neoplasm of peripheral nerves and autonomic nervous system, unspecified: Secondary | ICD-10-CM

## 2021-07-18 ENCOUNTER — Other Ambulatory Visit: Payer: Self-pay

## 2021-07-18 ENCOUNTER — Other Ambulatory Visit: Payer: 59

## 2021-07-20 ENCOUNTER — Other Ambulatory Visit: Payer: 59

## 2021-07-20 ENCOUNTER — Other Ambulatory Visit: Payer: Self-pay

## 2021-07-20 ENCOUNTER — Ambulatory Visit: Payer: 59

## 2021-07-20 DIAGNOSIS — Q666 Other congenital valgus deformities of feet: Secondary | ICD-10-CM | POA: Diagnosis not present

## 2021-07-20 DIAGNOSIS — D3610 Benign neoplasm of peripheral nerves and autonomic nervous system, unspecified: Secondary | ICD-10-CM

## 2021-07-20 NOTE — Progress Notes (Signed)
SITUATION: Reason for Visit: Fitting and Delivery of Custom Fabricated Foot Orthoses Patient Report: Patient reports comfort and is satisfied with device.  OBJECTIVE DATA: Patient History / Diagnosis:     ICD-10-CM   1. Pes planovalgus  Q66.6     2. Neuroma  D36.10       Provided Device:  Custom Functional Foot Orthotics     [facility]: [phone]  GOAL OF ORTHOSIS - Improve gait - Decrease energy expenditure - Improve Balance - Provide Triplanar stability of foot complex - Facilitate motion  ACTIONS PERFORMED Patient was fit with foot orthotics trimmed to shoe last. Patient tolerated fittign procedure.   Patient was provided with verbal and written instruction and demonstration regarding donning, doffing, wear, care, proper fit, function, purpose, cleaning, and use of the orthosis and in all related precautions and risks and benefits regarding the orthosis.  Patient was also provided with verbal instruction regarding how to report any failures or malfunctions of the orthosis and necessary follow up care. Patient was also instructed to contact our office regarding any change in status that may affect the function of the orthosis.  Patient demonstrated independence with proper donning, doffing, and fit and verbalized understanding of all instructions.  PLAN: Patient is to follow up in one week or as necessary (PRN). All questions were answered and concerns addressed. Plan of care was discussed with and agreed upon by the patient.

## 2021-08-01 ENCOUNTER — Ambulatory Visit: Payer: 59 | Admitting: Podiatry

## 2021-08-01 ENCOUNTER — Other Ambulatory Visit: Payer: Self-pay

## 2021-08-01 ENCOUNTER — Encounter: Payer: Self-pay | Admitting: Podiatry

## 2021-08-01 ENCOUNTER — Ambulatory Visit (INDEPENDENT_AMBULATORY_CARE_PROVIDER_SITE_OTHER): Payer: 59 | Admitting: Podiatry

## 2021-08-01 DIAGNOSIS — D3610 Benign neoplasm of peripheral nerves and autonomic nervous system, unspecified: Secondary | ICD-10-CM

## 2021-08-01 NOTE — Progress Notes (Signed)
?Subjective:  ?Patient ID: Andrew Hooper, male    DOB: Nov 02, 1965,  MRN: [id number] ? ?Chief Complaint  ?Patient presents with  ? Neuroma  ?  Right foot   ? ? ?56 y.o. male presents with the above complaint.  Patient presents for follow-up of right second interspace neuroma.  Patient states he is doing a lot better.  The injection helped.  He started notice some of the residual pain come back.  He would like to know if he can do another injection.  He would like to hold off on alcohol injections and surgical excision for now.  He is doing well with orthotics. ? ? ?Review of Systems: Negative except as noted in the HPI. Denies N/V/F/Ch. ? ?Past Medical History:  ?Diagnosis Date  ? Complication of anesthesia   ? rash afterwords"  ? Diverticulitis   ? GERD (gastroesophageal reflux disease)   ? Hypertension   ? Reflux   ? ? ?Current Outpatient Medications:  ?  acetaminophen (TYLENOL) 500 MG tablet, Take by mouth as needed. , Disp: , Rfl:  ?  Cholecalciferol 50 MCG (2000 UT) CAPS, Take by mouth daily. , Disp: , Rfl:  ?  escitalopram (LEXAPRO) 10 MG tablet, Take 10 mg by mouth daily., Disp: , Rfl:  ?  irbesartan (AVAPRO) 300 MG tablet, Take 300 mg by mouth daily., Disp: , Rfl:  ?  loratadine (CLARITIN) 10 MG tablet, Take 1 tablet by mouth daily as needed for allergies., Disp: , Rfl:  ?  metoprolol tartrate (LOPRESSOR) 100 MG tablet, Take 1 tablet by mouth 2 hours prior to Cardiac CT, Disp: 1 tablet, Rfl: 0 ?  naproxen sodium (ALEVE) 220 MG tablet, Take 220 mg by mouth as needed., Disp: , Rfl:  ?  rosuvastatin (CRESTOR) 10 MG tablet, TAKE 1 TABLET(10 MG) BY MOUTH DAILY, Disp: 30 tablet, Rfl: 11 ?  zolpidem (AMBIEN) 10 MG tablet, Take 10 mg by mouth at bedtime as needed for sleep., Disp: , Rfl:  ? ?Social History  ? ?Tobacco Use  ?Smoking Status Never  ?Smokeless Tobacco Never  ? ? ?Allergies  ?Allergen Reactions  ? Sulfamethoxazole Rash  ?  Sulfa based antibiotics   ? ?Objective:  ?There were no vitals filed for this  visit. ?There is no height or weight on file to calculate BMI. ?Constitutional Well developed. ?Well nourished.  ?Vascular Dorsalis pedis pulses palpable bilaterally. ?Posterior tibial pulses palpable bilaterally. ?Capillary refill normal to all digits.  ?No cyanosis or clubbing noted. ?Pedal hair growth normal.  ?Neurologic Normal speech. ?Oriented to person, place, and time. ?Epicritic sensation to light touch grossly present bilaterally.  ?Dermatologic Nails well groomed and normal in appearance. ?No open wounds. ?No skin lesions.  ?Orthopedic: Pain on palpation to the right second or space.  Positive Mulder's click noted.  Radiographically motorists [place] sign noted.  No other bony abnormalities identified no other acute deformities noted.  ? ?Radiographs: 3 views of skeletally mature the right foot: There is a decrease in intermetatarsal space between the second and third and normal intermetatarsal space between first and third fourth.  [place] sign noted. ?Assessment:  ? ?1. Neuroma   ? ? ?Plan:  ?Patient was evaluated and treated and all questions answered. ? ?Right second interspace neuroma ?-All questions and concerns were discussed with the patient in extensive detail. ?-He will also benefit from orthotics with metatarsal pad to take the pressure off of the submetatarsal 5.  For now we will focus on conservative treatment care.  Patient states  understand would appreciate this. ?-He will benefit from second steroid injection to help decrease acute inflammatory component associate with pain and around the nerve.  ?-At this time if his pain returns we will discuss alcohol injection versus surgical excision.  She states understanding ? ?Pes planovalgus ?-I explained to patient the etiology of pes planovalgus and relationship with neuroma neuro and various treatment options were discussed.  Given patient foot structure in the setting of Planter fasciitis I believe patient will benefit from custom-made  orthotics to help control the hindfoot motion support the arch of the foot and take the stress away from neuroma patient agrees with the plan like to proceed with orthotics ?Orthotics are functioning well. ? ? ?No follow-ups on file.  ?

## 2021-09-09 ENCOUNTER — Emergency Department (HOSPITAL_BASED_OUTPATIENT_CLINIC_OR_DEPARTMENT_OTHER): Payer: 59

## 2021-09-09 ENCOUNTER — Emergency Department (HOSPITAL_BASED_OUTPATIENT_CLINIC_OR_DEPARTMENT_OTHER)
Admission: EM | Admit: 2021-09-09 | Discharge: 2021-09-09 | Disposition: A | Discharge status: Home / Self Care | Payer: 59 | Attending: Emergency Medicine | Admitting: Emergency Medicine

## 2021-09-09 ENCOUNTER — Encounter (HOSPITAL_BASED_OUTPATIENT_CLINIC_OR_DEPARTMENT_OTHER): Payer: Self-pay | Admitting: Emergency Medicine

## 2021-09-09 DIAGNOSIS — Z79899 Other long term (current) drug therapy: Secondary | ICD-10-CM | POA: Insufficient documentation

## 2021-09-09 DIAGNOSIS — R5383 Other fatigue: Secondary | ICD-10-CM | POA: Diagnosis not present

## 2021-09-09 DIAGNOSIS — R4182 Altered mental status, unspecified: Secondary | ICD-10-CM | POA: Diagnosis present

## 2021-09-09 DIAGNOSIS — R7989 Other specified abnormal findings of blood chemistry: Secondary | ICD-10-CM

## 2021-09-09 DIAGNOSIS — I10 Essential (primary) hypertension: Secondary | ICD-10-CM | POA: Diagnosis not present

## 2021-09-09 DIAGNOSIS — R531 Weakness: Secondary | ICD-10-CM | POA: Diagnosis not present

## 2021-09-09 DIAGNOSIS — R55 Syncope and collapse: Secondary | ICD-10-CM | POA: Insufficient documentation

## 2021-09-09 DIAGNOSIS — Z20822 Contact with and (suspected) exposure to covid-19: Secondary | ICD-10-CM | POA: Insufficient documentation

## 2021-09-09 DIAGNOSIS — D72829 Elevated white blood cell count, unspecified: Secondary | ICD-10-CM | POA: Diagnosis not present

## 2021-09-09 DIAGNOSIS — E7220 Disorder of urea cycle metabolism, unspecified: Secondary | ICD-10-CM | POA: Diagnosis not present

## 2021-09-09 LAB — COMPREHENSIVE METABOLIC PANEL
ALT: 22 U/L (ref 0–44)
AST: 12 U/L — ABNORMAL LOW (ref 15–41)
Albumin: 4.3 g/dL (ref 3.5–5.0)
Alkaline Phosphatase: 60 U/L (ref 38–126)
Anion gap: 9 (ref 5–15)
BUN: 32 mg/dL — ABNORMAL HIGH (ref 6–20)
CO2: 26 mmol/L (ref 22–32)
Calcium: 9.4 mg/dL (ref 8.9–10.3)
Chloride: 105 mmol/L (ref 98–111)
Creatinine, Ser: 0.82 mg/dL (ref 0.61–1.24)
GFR, Estimated: >60 mL/min (ref >60)
Glucose, Bld: 125 mg/dL — ABNORMAL HIGH (ref 70–99)
Potassium: 4.2 mmol/L (ref 3.5–5.1)
Sodium: 140 mmol/L (ref 135–145)
Total Bilirubin: 0.4 mg/dL (ref 0.3–1.2)
Total Protein: 6.8 g/dL (ref 6.5–8.1)

## 2021-09-09 LAB — RAPID URINE DRUG SCREEN, HOSP PERFORMED
Amphetamines: NOT DETECTED
Barbiturates: NOT DETECTED
Benzodiazepines: NOT DETECTED
Cocaine: NOT DETECTED
Opiates: NOT DETECTED
Tetrahydrocannabinol: NOT DETECTED

## 2021-09-09 LAB — BASIC METABOLIC PANEL
Anion gap: 10 (ref 5–15)
BUN: 31 mg/dL — ABNORMAL HIGH (ref 6–20)
CO2: 26 mmol/L (ref 22–32)
Calcium: 9.5 mg/dL (ref 8.9–10.3)
Chloride: 104 mmol/L (ref 98–111)
Creatinine, Ser: 0.85 mg/dL (ref 0.61–1.24)
GFR, Estimated: >60 mL/min (ref >60)
Glucose, Bld: 127 mg/dL — ABNORMAL HIGH (ref 70–99)
Potassium: 4.1 mmol/L (ref 3.5–5.1)
Sodium: 140 mmol/L (ref 135–145)

## 2021-09-09 LAB — I-STAT VENOUS BLOOD GAS, ED
Acid-Base Excess: 1 mmol/L (ref 0.0–2.0)
Bicarbonate: 27.1 mmol/L (ref 20.0–28.0)
Calcium, Ion: 1.27 mmol/L (ref 1.15–1.40)
HCT: 42 % (ref 39.0–52.0)
Hemoglobin: 14.3 g/dL (ref 13.0–17.0)
O2 Saturation: 90 %
Patient temperature: 98
Potassium: 4.2 mmol/L (ref 3.5–5.1)
Sodium: 140 mmol/L (ref 135–145)
TCO2: 28 mmol/L (ref 22–32)
pCO2, Ven: 46.5 mmHg (ref 44–60)
pH, Ven: 7.372 (ref 7.25–7.43)
pO2, Ven: 61 mmHg — ABNORMAL HIGH (ref 32–45)

## 2021-09-09 LAB — TSH: TSH: 1.046 u[IU]/mL (ref 0.350–4.500)

## 2021-09-09 LAB — URINALYSIS, ROUTINE W REFLEX MICROSCOPIC
Bilirubin Urine: NEGATIVE
Glucose, UA: NEGATIVE mg/dL
Hgb urine dipstick: NEGATIVE
Ketones, ur: NEGATIVE mg/dL
Leukocytes,Ua: NEGATIVE
Nitrite: NEGATIVE
Protein, ur: NEGATIVE mg/dL
Specific Gravity, Urine: 1.02 (ref 1.005–1.030)
pH: 7 (ref 5.0–8.0)

## 2021-09-09 LAB — CBC
HCT: 44.8 % (ref 39.0–52.0)
Hemoglobin: 14.2 g/dL (ref 13.0–17.0)
MCH: 28.2 pg (ref 26.0–34.0)
MCHC: 31.7 g/dL (ref 30.0–36.0)
MCV: 89.1 fL (ref 80.0–100.0)
Platelets: 260 10*3/uL (ref 150–400)
RBC: 5.03 MIL/uL (ref 4.22–5.81)
RDW: 15 % (ref 11.5–15.5)
WBC: 12.3 10*3/uL — ABNORMAL HIGH (ref 4.0–10.5)
nRBC: 0 % (ref 0.0–0.2)

## 2021-09-09 LAB — AMMONIA: Ammonia: 48 umol/L — ABNORMAL HIGH (ref 9–35)

## 2021-09-09 LAB — TROPONIN I (HIGH SENSITIVITY): Troponin I (High Sensitivity): <2 ng/L (ref <18)

## 2021-09-09 LAB — RESP PANEL BY RT-PCR (FLU A&B, COVID) ARPGX2
Influenza A by PCR: NEGATIVE
Influenza B by PCR: NEGATIVE
SARS Coronavirus 2 by RT PCR: NEGATIVE

## 2021-09-09 LAB — LACTIC ACID, PLASMA: Lactic Acid, Venous: 1.1 mmol/L (ref 0.5–1.9)

## 2021-09-09 LAB — ETHANOL: Alcohol, Ethyl (B): <10 mg/dL (ref <10)

## 2021-09-09 LAB — PROTIME-INR
INR: 1 (ref 0.8–1.2)
Prothrombin Time: 12.9 seconds (ref 11.4–15.2)

## 2021-09-09 LAB — APTT: aPTT: 28 seconds (ref 24–36)

## 2021-09-09 LAB — CBG MONITORING, ED: Glucose-Capillary: 121 mg/dL — ABNORMAL HIGH (ref 70–99)

## 2021-09-09 MED ORDER — SODIUM CHLORIDE 0.9 % IV BOLUS
1000.0000 mL | Freq: Once | INTRAVENOUS | Status: AC
  Administered 2021-09-09: 1000 mL via INTRAVENOUS

## 2021-09-09 MED ORDER — SODIUM CHLORIDE 0.9 % IV BOLUS: 500.0000 mL | Freq: Once | INTRAVENOUS | Status: DC

## 2021-09-09 MED ORDER — SODIUM CHLORIDE 0.9 % IV SOLN: 1000.0000 mL | INTRAVENOUS | Status: DC

## 2021-09-09 MED ORDER — SODIUM CHLORIDE 0.9 % IV SOLN: 100.0000 mL/h | INTRAVENOUS | Status: DC

## 2021-09-09 MED ORDER — SODIUM CHLORIDE 0.9 % IV SOLN
100.0000 mL/h | INTRAVENOUS | Status: DC
  Administered 2021-09-09: 100 mL/h via INTRAVENOUS

## 2021-09-09 MED ORDER — SODIUM CHLORIDE 0.9 % IV BOLUS (SEPSIS): 1000.0000 mL | Freq: Once | INTRAVENOUS | Status: DC

## 2021-09-09 NOTE — ED Notes (Signed)
[place] RT note: RN notified RT of pt. with hx. OSA/CPAP use currently having desaturation episode after being placed in room 6, upon arrival with family at bedside, sats 92% with pt. easily awoken, placed 4 lpm n/c on pt. and O2 mask if needed from being mouth breather, rechecked @ 1055 96% sats-titrated down to 2 lpm'@1101'$ , chart to be reviewed, RT to monitor. ?

## 2021-09-09 NOTE — ED Triage Notes (Signed)
Wasn't feeling well this am ,fatigued, but went to church,wifre was called and he had to be helped into car, very hard to arouse at the time.  ?

## 2021-09-09 NOTE — ED Notes (Signed)
Discharge instructions reviewed and explained, pt verbalized understanding.  ?

## 2021-09-09 NOTE — ED Notes (Signed)
RT note: VBG results obtained and WNL with MD/RN made aware, 2 lpm n/c remains in place due to OSA hx, RT to monitor. ?

## 2021-09-09 NOTE — ED Provider Notes (Signed)
?[facility] ?Provider Note ? ? ?CSN: [id number] ?Arrival date & time: 09/09/21  1013 ? ?  ? ?History ? ?Chief Complaint  ?Patient presents with  ? Near Syncope  ? ? ?Andrew Hooper is a 56 y.o. male. ? ? ?Near Syncope ? ? ?Patient has a history of diverticulitis, reflux, hypertension insomnia and back pain.  Patient presents to the ED for altered mental status.  Family states patient initially got up this morning and was able to make himself breakfast.  He did take some Benadryl last night to help him sleep but wife states he was initially fine in the morning.  However she went to church and the daughter was still at home with her father.  She then found him lethargic and hard to arouse.  He nearly fell over when walking.  Told his daughter that he might of taken some bad medicine.  In the ER the patient will answer my questions.  He is very somnolent.  He states he was not feeling well this morning with fatigue.  He measured his temperature was only 96.  He has not had any vomiting or diarrhea.  He is not having any problems with any headache.  No neck pain.  No chest pain or shortness of breath.  Patient does not recall if he took any other medications other than the Benadryl last evening.  Patient had not been drinking alcohol according to the family. ? ?Home Medications ?Prior to Admission medications   ?Medication Sig Start Date End Date Taking? Authorizing Provider  ?acetaminophen (TYLENOL) 500 MG tablet Take by mouth as needed.     [provider]  ?Cholecalciferol 50 MCG (2000 UT) CAPS Take by mouth daily.     [provider]  ?escitalopram (LEXAPRO) 10 MG tablet Take 10 mg by mouth daily. 11/12/19   [provider]  ?irbesartan (AVAPRO) 300 MG tablet Take 300 mg by mouth daily. 11/11/19   [provider]  ?loratadine (CLARITIN) 10 MG tablet Take 1 tablet by mouth daily as needed for allergies. 10/15/19   [provider]  ?metoprolol  tartrate (LOPRESSOR) 100 MG tablet Take 1 tablet by mouth 2 hours prior to Cardiac CT 03/09/20   Jettie Booze, MD  ?naproxen sodium (ALEVE) 220 MG tablet Take 220 mg by mouth as needed.    [provider]  ?rosuvastatin (CRESTOR) 10 MG tablet TAKE 1 TABLET(10 MG) BY MOUTH DAILY 04/10/21   Jettie Booze, MD  ?zolpidem (AMBIEN) 10 MG tablet Take 10 mg by mouth at bedtime as needed for sleep. 12/29/19   [provider]  ?   ? ?Allergies    ?Sulfamethoxazole   ? ?Review of Systems   ?Review of Systems  ?Constitutional:  Negative for fever.  ?Cardiovascular:  Positive for near-syncope.  ? ?Physical Exam ?Updated Vital Signs ?BP 116/82   Pulse 73   Temp 97.6 ?F (36.4 ?C) (Oral)   Resp 14   SpO2 95%  ?Physical Exam ?Vitals and nursing note reviewed.  ?Constitutional:   ?   General: He is not in acute distress. ?   Appearance: He is well-developed.  ?HENT:  ?   Head: Normocephalic and atraumatic.  ?   Right Ear: External ear normal.  ?   Left Ear: External ear normal.  ?Eyes:  ?   General: No scleral icterus.    ?   Right eye: No discharge.     ?   Left eye: No discharge.  ?  Conjunctiva/sclera: Conjunctivae normal.  ?Neck:  ?   Trachea: No tracheal deviation.  ?Cardiovascular:  ?   Rate and Rhythm: Normal rate and regular rhythm.  ?Pulmonary:  ?   Effort: Pulmonary effort is normal. No respiratory distress.  ?   Breath sounds: Normal breath sounds. No stridor. No wheezing or rales.  ?Abdominal:  ?   General: Bowel sounds are normal. There is no distension.  ?   Palpations: Abdomen is soft.  ?   Tenderness: There is no abdominal tenderness. There is no guarding or rebound.  ?Musculoskeletal:     ?   General: No tenderness or deformity.  ?   Cervical back: Neck supple.  ?Skin: ?   General: Skin is warm and dry.  ?   Findings: No rash.  ?Neurological:  ?   General: No focal deficit present.  ?   Mental Status: He is oriented to person, place, and time. He is lethargic.  ?   GCS: GCS eye  subscore is 3. GCS verbal subscore is 5. GCS motor subscore is 6.  ?   Cranial Nerves: No cranial nerve deficit (no facial droop, extraocular movements intact, no slurred speech).  ?   Sensory: No sensory deficit.  ?   Motor: Weakness present. No abnormal muscle tone or seizure activity.  ?   Comments: Patient is oriented to person place and.  He is lethargic but does answer his questions.  Able to move all extremities without difficulty although is slow to do so, does appear generally weak  ?Psychiatric:     ?   Mood and Affect: Mood normal.  ? ? ?ED Results / Procedures / Treatments   ?Labs ?(all labs ordered are listed, but only abnormal results are displayed) ?Labs Reviewed  ?BASIC METABOLIC PANEL - Abnormal; Notable for the following components:  ?    Result Value  ? Glucose, Bld 127 (*)   ? BUN 31 (*)   ? All other components within normal limits  ?CBC - Abnormal; Notable for the following components:  ? WBC 12.3 (*)   ? All other components within normal limits  ?AMMONIA - Abnormal; Notable for the following components:  ? Ammonia 48 (*)   ? All other components within normal limits  ?COMPREHENSIVE METABOLIC PANEL - Abnormal; Notable for the following components:  ? Glucose, Bld 125 (*)   ? BUN 32 (*)   ? AST 12 (*)   ? All other components within normal limits  ?CBG MONITORING, ED - Abnormal; Notable for the following components:  ? Glucose-Capillary 121 (*)   ? All other components within normal limits  ?I-STAT VENOUS BLOOD GAS, ED - Abnormal; Notable for the following components:  ? pO2, Ven 61 (*)   ? All other components within normal limits  ?RESP PANEL BY RT-PCR (FLU A&B, COVID) ARPGX2  ?ETHANOL  ?PROTIME-INR  ?APTT  ?RAPID URINE DRUG SCREEN, HOSP PERFORMED  ?URINALYSIS, ROUTINE W REFLEX MICROSCOPIC  ?LACTIC ACID, PLASMA  ?TSH  ?TROPONIN I (HIGH SENSITIVITY)  ? ? ?EKG ?EKG Interpretation ? ?Date/Time:  Sunday September 09 2021 10:30:20 EDT ?Ventricular Rate:  86 ?PR Interval:  193 ?QRS Duration: 94 ?QT  Interval:  354 ?QTC Calculation: 424 ?R Axis:   48 ?Text Interpretation: Sinus rhythm No significant change since last tracing Confirmed by Dorie Rank [phone]) on 09/09/2021 10:33:20 AM ? ?Radiology ?CT HEAD WO CONTRAST ? ?Result Date: 09/09/2021 ?CLINICAL DATA:  Fatigue, altered mental status, stroke suspected EXAM: CT HEAD WITHOUT CONTRAST  TECHNIQUE: Contiguous axial images were obtained from the base of the skull through the vertex without intravenous contrast. RADIATION DOSE REDUCTION: This exam was performed according to the departmental dose-optimization program which includes automated exposure control, adjustment of the mA and/or kV according to patient size and/or use of iterative reconstruction technique. COMPARISON:  None. FINDINGS: Brain: No evidence of acute infarction, hemorrhage, hydrocephalus, extra-axial collection or mass lesion/mass effect. Vascular: No hyperdense vessel or unexpected calcification. Skull: Normal. Negative for fracture or focal lesion. Sinuses/Orbits: No acute finding. Other: None. IMPRESSION: No acute intracranial pathology. Electronically Signed   By: Delanna Ahmadi M.D.   On: 09/09/2021 11:40  ? ?DG Chest Portable 1 View ? ?Result Date: 09/09/2021 ?CLINICAL DATA:  Weakness.  Lethargy. EXAM: PORTABLE CHEST 1 VIEW COMPARISON:  02/29/2020. FINDINGS: Cardiac silhouette is normal in size. No mediastinal or hilar masses. Low lung volumes. Lungs are clear. No convincing pleural effusion. No pneumothorax. Skeletal structures are grossly intact. IMPRESSION: No active disease. Electronically Signed   By: Lajean Manes M.D.   On: 09/09/2021 11:37   ? ?Procedures ?Procedures  ? ? ?Medications Ordered in ED ?Medications  ?sodium chloride 0.9 % bolus 1,000 mL (0 mLs Intravenous Stopped 09/09/21 1255)  ?  Followed by  ?0.9 %  sodium chloride infusion (100 mL/hr Intravenous New Bag/Given 09/09/21 1258)  ? ? ?ED Course/ Medical Decision Making/ A&P ?Clinical Course as of 09/09/21 1526  ?Nancy Fetter Sep 09, 2021   ?1238 I-Stat venous blood gas, [facility] ED)(!) ?No hypercarbia no acidosis [JK]  ?1238 Ammonia(!) ?Ammonia level is increased [JK]  ?1238 Ethanol ?Negative [JK]  ?3361 Basic metabolic panel(!) ?Normal [JK]  ?1238 CBC(!) ?

## 2021-09-09 NOTE — ED Notes (Signed)
RT Note: Pt. seen and is more awake/alert at this time with X family at bedside, n/c oxygen 2 lpm removed, RN made aware, Rt to monitor, remains on monitor. ?

## 2021-09-09 NOTE — Discharge Instructions (Signed)
Return to the [facility] ED for any recurrent symptoms or if you do not continue to improve throughout the evening.  Follow up with your doctor to be rechecked. ?

## 2021-09-09 NOTE — ED Notes (Signed)
Unable to obtain all answers for triage due to patient status.  ?

## 2021-09-28 ENCOUNTER — Ambulatory Visit (INDEPENDENT_AMBULATORY_CARE_PROVIDER_SITE_OTHER): Payer: 59 | Admitting: Podiatry

## 2021-09-28 ENCOUNTER — Telehealth: Payer: Self-pay

## 2021-09-28 DIAGNOSIS — Z01818 Encounter for other preprocedural examination: Secondary | ICD-10-CM

## 2021-09-28 DIAGNOSIS — D3610 Benign neoplasm of peripheral nerves and autonomic nervous system, unspecified: Secondary | ICD-10-CM | POA: Diagnosis not present

## 2021-09-28 NOTE — Telephone Encounter (Signed)
DOS 10/01/2021 ? ?NEURECTOMY RT - 28080 ? ?[employer] EFFECTIVE DATE - 06/03/2021 ? ?PLAN DEDUCTIBLE - $3000.00 W/$30.32 REMAINING ?OUT OF POCKET - $3000.00 W/ $0.00 REMAINING ?COPAY $0.00 ?COINSURANCE - 0%  ? ? ?Notification or Prior Authorization is not required for the requested services ? ?This Careers adviser does not currently require a prior authorization for these services. If you have general questions about the prior authorization requirements, please call us at [phone] or visit [email or web address] > Clinician Resources > Advance and Admission Notification Requirements. The number above acknowledges your notification. Please write this number down for future reference. Notification is not a guarantee of coverage or payment. ? ?Decision ID #:[id number] ?

## 2021-10-01 ENCOUNTER — Encounter: Payer: Self-pay | Admitting: Podiatry

## 2021-10-01 ENCOUNTER — Other Ambulatory Visit: Payer: Self-pay | Admitting: Podiatry

## 2021-10-01 DIAGNOSIS — G5761 Lesion of plantar nerve, right lower limb: Secondary | ICD-10-CM | POA: Diagnosis not present

## 2021-10-01 MED ORDER — IBUPROFEN 800 MG PO TABS
800.0000 mg | ORAL_TABLET | Freq: Four times a day (QID) | ORAL | 1 refills | Status: DC | PRN
Start: 2021-10-01 — End: 2022-02-07

## 2021-10-01 MED ORDER — OXYCODONE-ACETAMINOPHEN 5-325 MG PO TABS: 1.0000 | ORAL_TABLET | ORAL | 0 refills | Status: DC | PRN

## 2021-10-04 NOTE — Progress Notes (Signed)
?Subjective:  ?Patient ID: Andrew Hooper, male    DOB: [birth year],  MRN: [id number] ? ?Chief Complaint  ?Patient presents with  ? Neuroma  ?  Right foot- no improvement, pt reports the pads did not help with the pain- mentioned it almost made it feel worse. Injection did not relief any pain this round. Further evaluation   ? ? ?56 y.o. male presents with the above complaint.  Patient presents for follow-up of right second interspace neuroma.  He states this started to hurt again and is causing him a lot of pain.  He would like to discuss surgical options for this now. ? ?Review of Systems: Negative except as noted in the HPI. Denies N/V/F/Ch. ? ?Past Medical History:  ?Diagnosis Date  ? Complication of anesthesia   ? rash afterwords"  ? Diverticulitis   ? GERD (gastroesophageal reflux disease)   ? Hypertension   ? Reflux   ? ? ?Current Outpatient Medications:  ?  acetaminophen (TYLENOL) 500 MG tablet, Take by mouth as needed. , Disp: , Rfl:  ?  Cholecalciferol 50 MCG (2000 UT) CAPS, Take by mouth daily. , Disp: , Rfl:  ?  CLENPIQ 10-3.5-12 MG-GM -GM/160ML SOLN, SMARTSIG:160 Milliliter(s) By Mouth, Disp: , Rfl:  ?  escitalopram (LEXAPRO) 10 MG tablet, Take 10 mg by mouth daily., Disp: , Rfl:  ?  ibuprofen (ADVIL) 800 MG tablet, Take 1 tablet (800 mg total) by mouth every 6 (six) hours as needed., Disp: 60 tablet, Rfl: 1 ?  irbesartan (AVAPRO) 300 MG tablet, Take 300 mg by mouth daily., Disp: , Rfl:  ?  loratadine (CLARITIN) 10 MG tablet, Take 1 tablet by mouth daily as needed for allergies., Disp: , Rfl:  ?  metoprolol tartrate (LOPRESSOR) 100 MG tablet, Take 1 tablet by mouth 2 hours prior to Cardiac CT, Disp: 1 tablet, Rfl: 0 ?  naproxen sodium (ALEVE) 220 MG tablet, Take 220 mg by mouth as needed., Disp: , Rfl:  ?  oxyCODONE-acetaminophen (PERCOCET) 5-325 MG tablet, Take 1 tablet by mouth every 4 (four) hours as needed for severe pain., Disp: 30 tablet, Rfl: 0 ?  rosuvastatin (CRESTOR) 10 MG tablet, TAKE 1  TABLET(10 MG) BY MOUTH DAILY, Disp: 30 tablet, Rfl: 11 ?  zolpidem (AMBIEN) 10 MG tablet, Take 10 mg by mouth at bedtime as needed for sleep., Disp: , Rfl:  ? ?Social History  ? ?Tobacco Use  ?Smoking Status Never  ?Smokeless Tobacco Never  ? ? ?Allergies  ?Allergen Reactions  ? Sulfamethoxazole Rash  ?  Sulfa based antibiotics   ? ?Objective:  ?There were no vitals filed for this visit. ?There is no height or weight on file to calculate BMI. ?Constitutional Well developed. ?Well nourished.  ?Vascular Dorsalis pedis pulses palpable bilaterally. ?Posterior tibial pulses palpable bilaterally. ?Capillary refill normal to all digits.  ?No cyanosis or clubbing noted. ?Pedal hair growth normal.  ?Neurologic Normal speech. ?Oriented to person, place, and time. ?Epicritic sensation to light touch grossly present bilaterally.  ?Dermatologic Nails well groomed and normal in appearance. ?No open wounds. ?No skin lesions.  ?Orthopedic: Pain on palpation to the right second or space.  Positive Mulder's click noted.  Radiographically motorists [place] sign noted.  No other bony abnormalities identified no other acute deformities noted.  ? ?Radiographs: 3 views of skeletally mature the right foot: There is a decrease in intermetatarsal space between the second and third and normal intermetatarsal space between first and third fourth.  [place] sign noted. ?Assessment:  ? ?No  diagnosis found. ? ? ?Plan:  ?Patient was evaluated and treated and all questions answered. ? ?Right second interspace neuroma ?-All questions and concerns were discussed with the patient in extensive detail. ?-Clinically the pain continues to get worse.  Orthotics management and injection in the past have not helped.  He would like to discuss surgical options at this time.  I discussed my preoperative intraoperative postoperative plan in extensive detail.  He states understanding.  He would like to proceed with surgery to have this right second interspace  neuroma excised out. ?-Informed surgical risk consent was reviewed and read aloud to the patient.  I reviewed the films.  I have discussed my findings with the patient in great detail.  I have discussed all risks including but not limited to infection, stiffness, scarring, limp, disability, deformity, damage to blood vessels and nerves, numbness, poor healing, need for braces, arthritis, chronic pain, amputation, death.  All benefits and realistic expectations discussed in great detail.  I have made no promises as to the outcome.  I have provided realistic expectations.  I have offered the patient a 2nd opinion, which they have declined and assured me they preferred to proceed despite the risks ? ?Pes planovalgus ?-I explained to patient the etiology of pes planovalgus and relationship with neuroma neuro and various treatment options were discussed.  Given patient foot structure in the setting of Planter fasciitis I believe patient will benefit from custom-made orthotics to help control the hindfoot motion support the arch of the foot and take the stress away from neuroma patient agrees with the plan like to proceed with orthotics ?Orthotics are functioning well. ? ? ?No follow-ups on file.  ?

## 2021-10-08 ENCOUNTER — Encounter: Payer: Self-pay | Admitting: Podiatry

## 2021-10-10 ENCOUNTER — Ambulatory Visit (INDEPENDENT_AMBULATORY_CARE_PROVIDER_SITE_OTHER): Payer: 59 | Admitting: Podiatry

## 2021-10-10 DIAGNOSIS — D3610 Benign neoplasm of peripheral nerves and autonomic nervous system, unspecified: Secondary | ICD-10-CM

## 2021-10-10 DIAGNOSIS — Z9889 Other specified postprocedural states: Secondary | ICD-10-CM

## 2021-10-10 NOTE — Progress Notes (Signed)
?  Subjective:  ?Patient ID: Andrew Hooper, male    DOB: 07-Sep-1965,  MRN: [id number] ? ?Chief Complaint  ?Patient presents with  ? Routine Post Op  ?  POV #1 DOS 10/01/2021 RT 2ND INTERSPACE NEUROMA EXCISION  ? ? ?DOS: 10/01/2021 ?Procedure: Right second interspace neuroma excision ? ?56 y.o. male returns for post-op check.  Patient states that he is doing well.  No acute complaints.  Minimal pain.  Occasional numbness tingling bandages clean dry and intact ? ?Review of Systems: Negative except as noted in the HPI. Denies N/V/F/Ch. ? ?Past Medical History:  ?Diagnosis Date  ? Complication of anesthesia   ? rash afterwords"  ? Diverticulitis   ? GERD (gastroesophageal reflux disease)   ? Hypertension   ? Reflux   ? ? ?Current Outpatient Medications:  ?  acetaminophen (TYLENOL) 500 MG tablet, Take by mouth as needed. , Disp: , Rfl:  ?  Cholecalciferol 50 MCG (2000 UT) CAPS, Take by mouth daily. , Disp: , Rfl:  ?  CLENPIQ 10-3.5-12 MG-GM -GM/160ML SOLN, SMARTSIG:160 Milliliter(s) By Mouth, Disp: , Rfl:  ?  escitalopram (LEXAPRO) 10 MG tablet, Take 10 mg by mouth daily., Disp: , Rfl:  ?  ibuprofen (ADVIL) 800 MG tablet, Take 1 tablet (800 mg total) by mouth every 6 (six) hours as needed., Disp: 60 tablet, Rfl: 1 ?  irbesartan (AVAPRO) 300 MG tablet, Take 300 mg by mouth daily., Disp: , Rfl:  ?  loratadine (CLARITIN) 10 MG tablet, Take 1 tablet by mouth daily as needed for allergies., Disp: , Rfl:  ?  metoprolol tartrate (LOPRESSOR) 100 MG tablet, Take 1 tablet by mouth 2 hours prior to Cardiac CT, Disp: 1 tablet, Rfl: 0 ?  naproxen sodium (ALEVE) 220 MG tablet, Take 220 mg by mouth as needed., Disp: , Rfl:  ?  oxyCODONE-acetaminophen (PERCOCET) 5-325 MG tablet, Take 1 tablet by mouth every 4 (four) hours as needed for severe pain., Disp: 30 tablet, Rfl: 0 ?  rosuvastatin (CRESTOR) 10 MG tablet, TAKE 1 TABLET(10 MG) BY MOUTH DAILY, Disp: 30 tablet, Rfl: 11 ?  zolpidem (AMBIEN) 10 MG tablet, Take 10 mg by mouth at bedtime as  needed for sleep., Disp: , Rfl:  ? ?Social History  ? ?Tobacco Use  ?Smoking Status Never  ?Smokeless Tobacco Never  ? ? ?Allergies  ?Allergen Reactions  ? Sulfamethoxazole Rash  ?  Sulfa based antibiotics   ? ?Objective:  ?There were no vitals filed for this visit. ?There is no height or weight on file to calculate BMI. ?Constitutional Well developed. ?Well nourished.  ?Vascular Foot warm and well perfused. ?Capillary refill normal to all digits.   ?Neurologic Normal speech. ?Oriented to person, place, and time. ?Epicritic sensation to light touch grossly present bilaterally.  ?Dermatologic Skin healing well without signs of infection. Skin edges well coapted without signs of infection.  ?Orthopedic: Tenderness to palpation noted about the surgical site.  ? ?Radiographs: None ?Assessment:  ? ?1. Neuroma   ?2. Status post foot surgery   ? ?Plan:  ?Patient was evaluated and treated and all questions answered. ? ?S/p foot surgery right ?-Progressing as expected post-operatively. ?-XR: None ?-WB Status: Weightbearing as tolerated in surgical shoe ?-Sutures: Intact.  No clinical signs of Deis is noted.  No complication noted. ?-Medications: None ?-Foot redressed. ? ?No follow-ups on file.  ?

## 2021-10-19 ENCOUNTER — Telehealth: Payer: Self-pay | Admitting: Interventional Cardiology

## 2021-10-19 NOTE — Telephone Encounter (Signed)
*  STAT* If patient is at the pharmacy, call can be transferred to refill team.   1. Which medications need to be refilled? (please list name of each medication and dose if known) irbesartan (AVAPRO) 300 MG tablet  2. Which pharmacy/location (including street and city if local pharmacy) is medication to be sent to? [id number] - [place], [place] - [address]  3. Do they need a 30 day or 90 day supply? 90 day

## 2021-10-22 ENCOUNTER — Other Ambulatory Visit: Payer: Self-pay

## 2021-10-22 MED ORDER — IRBESARTAN 300 MG PO TABS: 300.0000 mg | ORAL_TABLET | Freq: Every day | ORAL | 0 refills | Status: DC

## 2021-10-24 ENCOUNTER — Ambulatory Visit (INDEPENDENT_AMBULATORY_CARE_PROVIDER_SITE_OTHER): Payer: 59 | Admitting: Podiatry

## 2021-10-24 DIAGNOSIS — D3610 Benign neoplasm of peripheral nerves and autonomic nervous system, unspecified: Secondary | ICD-10-CM

## 2021-10-24 DIAGNOSIS — Z9889 Other specified postprocedural states: Secondary | ICD-10-CM

## 2021-10-24 NOTE — Progress Notes (Signed)
  Subjective:  Patient ID: Andrew Hooper, male    DOB: 1965/11/24,  MRN: [id number]  No chief complaint on file.   DOS: 10/01/2021 Procedure: Right second interspace neuroma excision  56 y.o. male returns for post-op check.  Patient states that he is doing well.  No acute complaints.  Minimal pain.  Occasional numbness tingling bandages clean dry and intact  Review of Systems: Negative except as noted in the HPI. Denies N/V/F/Ch.  Past Medical History:  Diagnosis Date   Complication of anesthesia    rash afterwords"   Diverticulitis    GERD (gastroesophageal reflux disease)    Hypertension    Reflux     Current Outpatient Medications:    acetaminophen (TYLENOL) 500 MG tablet, Take by mouth as needed. , Disp: , Rfl:    Cholecalciferol 50 MCG (2000 UT) CAPS, Take by mouth daily. , Disp: , Rfl:    CLENPIQ 10-3.5-12 MG-GM -GM/160ML SOLN, SMARTSIG:160 Milliliter(s) By Mouth, Disp: , Rfl:    escitalopram (LEXAPRO) 10 MG tablet, Take 10 mg by mouth daily., Disp: , Rfl:    ibuprofen (ADVIL) 800 MG tablet, Take 1 tablet (800 mg total) by mouth every 6 (six) hours as needed., Disp: 60 tablet, Rfl: 1   irbesartan (AVAPRO) 300 MG tablet, Take 1 tablet (300 mg total) by mouth daily. Call and schedule follow up appointment for further refills. [phone]. 1st attempt, Disp: 30 tablet, Rfl: 0   loratadine (CLARITIN) 10 MG tablet, Take 1 tablet by mouth daily as needed for allergies., Disp: , Rfl:    metoprolol tartrate (LOPRESSOR) 100 MG tablet, Take 1 tablet by mouth 2 hours prior to Cardiac CT, Disp: 1 tablet, Rfl: 0   naproxen sodium (ALEVE) 220 MG tablet, Take 220 mg by mouth as needed., Disp: , Rfl:    oxyCODONE-acetaminophen (PERCOCET) 5-325 MG tablet, Take 1 tablet by mouth every 4 (four) hours as needed for severe pain., Disp: 30 tablet, Rfl: 0   rosuvastatin (CRESTOR) 10 MG tablet, TAKE 1 TABLET(10 MG) BY MOUTH DAILY, Disp: 30 tablet, Rfl: 11   zolpidem (AMBIEN) 10 MG tablet, Take 10 mg  by mouth at bedtime as needed for sleep., Disp: , Rfl:   Social History   Tobacco Use  Smoking Status Never  Smokeless Tobacco Never    Allergies  Allergen Reactions   Sulfamethoxazole Rash    Sulfa based antibiotics    Objective:  There were no vitals filed for this visit. There is no height or weight on file to calculate BMI. Constitutional Well developed. Well nourished.  Vascular Foot warm and well perfused. Capillary refill normal to all digits.   Neurologic Normal speech. Oriented to person, place, and time. Epicritic sensation to light touch grossly present bilaterally.  Dermatologic Skin completely reepithelialized.  Numbness noted between the pacing surface of second and third digit.  No complication noted no dehiscence noted.  Orthopedic: Tenderness to palpation noted about the surgical site.   Radiographs: None Assessment:   No diagnosis found.  Plan:  Patient was evaluated and treated and all questions answered.  S/p foot surgery right -Progressing as expected post-operatively. -XR: None -WB Status: Weightbearing as tolerated in regular shoe -Sutures: Removed no clinical signs of Deis is noted.  No complication noted. -Medications: None -Foot redressed.  No follow-ups on file.

## 2022-01-30 ENCOUNTER — Encounter: Payer: Self-pay | Admitting: Gastroenterology

## 2022-01-31 ENCOUNTER — Ambulatory Visit (INDEPENDENT_AMBULATORY_CARE_PROVIDER_SITE_OTHER): Payer: 59 | Admitting: Podiatry

## 2022-01-31 DIAGNOSIS — D3610 Benign neoplasm of peripheral nerves and autonomic nervous system, unspecified: Secondary | ICD-10-CM | POA: Diagnosis not present

## 2022-01-31 DIAGNOSIS — M7751 Other enthesopathy of right foot: Secondary | ICD-10-CM | POA: Diagnosis not present

## 2022-02-05 ENCOUNTER — Observation Stay (HOSPITAL_COMMUNITY)
Admission: EM | Admit: 2022-02-05 | Discharge: 2022-02-07 | Disposition: A | Discharge status: Home / Self Care | Payer: 59 | Attending: Urology | Admitting: Urology

## 2022-02-05 ENCOUNTER — Encounter (HOSPITAL_COMMUNITY): Payer: Self-pay

## 2022-02-05 ENCOUNTER — Emergency Department (HOSPITAL_COMMUNITY): Payer: 59

## 2022-02-05 DIAGNOSIS — N202 Calculus of kidney with calculus of ureter: Secondary | ICD-10-CM | POA: Diagnosis not present

## 2022-02-05 DIAGNOSIS — I10 Essential (primary) hypertension: Secondary | ICD-10-CM | POA: Diagnosis not present

## 2022-02-05 DIAGNOSIS — N139 Obstructive and reflux uropathy, unspecified: Secondary | ICD-10-CM | POA: Insufficient documentation

## 2022-02-05 DIAGNOSIS — R1012 Left upper quadrant pain: Secondary | ICD-10-CM | POA: Diagnosis present

## 2022-02-05 DIAGNOSIS — Z79899 Other long term (current) drug therapy: Secondary | ICD-10-CM | POA: Insufficient documentation

## 2022-02-05 DIAGNOSIS — N200 Calculus of kidney: Secondary | ICD-10-CM

## 2022-02-05 LAB — COMPREHENSIVE METABOLIC PANEL
ALT: 21 U/L (ref 0–44)
AST: 17 U/L (ref 15–41)
Albumin: 4.3 g/dL (ref 3.5–5.0)
Alkaline Phosphatase: 71 U/L (ref 38–126)
Anion gap: 9 (ref 5–15)
BUN: 29 mg/dL — ABNORMAL HIGH (ref 6–20)
CO2: 25 mmol/L (ref 22–32)
Calcium: 9 mg/dL (ref 8.9–10.3)
Chloride: 106 mmol/L (ref 98–111)
Creatinine, Ser: 1.05 mg/dL (ref 0.61–1.24)
GFR, Estimated: >60 mL/min (ref >60)
Glucose, Bld: 109 mg/dL — ABNORMAL HIGH (ref 70–99)
Potassium: 3.8 mmol/L (ref 3.5–5.1)
Sodium: 140 mmol/L (ref 135–145)
Total Bilirubin: 0.7 mg/dL (ref 0.3–1.2)
Total Protein: 7.4 g/dL (ref 6.5–8.1)

## 2022-02-05 LAB — LIPASE, BLOOD: Lipase: 28 U/L (ref 11–51)

## 2022-02-05 LAB — CBC WITH DIFFERENTIAL/PLATELET
Abs Immature Granulocytes: 0.06 10*3/uL (ref 0.00–0.07)
Basophils Absolute: 0.1 10*3/uL (ref 0.0–0.1)
Basophils Relative: 0 %
Eosinophils Absolute: 0.2 10*3/uL (ref 0.0–0.5)
Eosinophils Relative: 1 %
HCT: 47.5 % (ref 39.0–52.0)
Hemoglobin: 15.2 g/dL (ref 13.0–17.0)
Immature Granulocytes: 0 %
Lymphocytes Relative: 12 %
Lymphs Abs: 1.7 10*3/uL (ref 0.7–4.0)
MCH: 29 pg (ref 26.0–34.0)
MCHC: 32 g/dL (ref 30.0–36.0)
MCV: 90.5 fL (ref 80.0–100.0)
Monocytes Absolute: 1 10*3/uL (ref 0.1–1.0)
Monocytes Relative: 7 %
Neutro Abs: 11.1 10*3/uL — ABNORMAL HIGH (ref 1.7–7.7)
Neutrophils Relative %: 80 %
Platelets: 244 10*3/uL (ref 150–400)
RBC: 5.25 MIL/uL (ref 4.22–5.81)
RDW: 14.6 % (ref 11.5–15.5)
WBC: 14.1 10*3/uL — ABNORMAL HIGH (ref 4.0–10.5)
nRBC: 0 % (ref 0.0–0.2)

## 2022-02-05 MED ORDER — SODIUM CHLORIDE (PF) 0.9 % IJ SOLN
INTRAMUSCULAR | Status: AC
  Filled 2022-02-05: qty 50

## 2022-02-05 MED ORDER — IOHEXOL 300 MG/ML  SOLN
100.0000 mL | Freq: Once | INTRAMUSCULAR | Status: AC | PRN
  Administered 2022-02-05: 100 mL via INTRAVENOUS

## 2022-02-05 NOTE — ED Notes (Signed)
Still waiting for IV

## 2022-02-05 NOTE — ED Triage Notes (Signed)
Pt presents with c/o upper left quad abdominal pain. Pt reports the pain started yesterday. Pt does report some nausea as well with some chills but no vomiting or diarrhea. Pt has a hx of diverticulitis.

## 2022-02-05 NOTE — ED Provider Triage Note (Signed)
Emergency Medicine Provider Triage Evaluation Note  Andrew Hooper , a 56 y.o. male  was evaluated in triage.  Pt complains of left-sided abdominal pain that started suddenly yesterday.  Previous history of kidney stones and diverticulitis.  No fever or chills.  Admits to nausea and increased belching.  No vomiting or diarrhea. No chest pain or SOB. Denies urinary symptoms.  Review of Systems  Positive: Abdominal pain Negative: dysuria  Physical Exam  BP (!) 152/98   Pulse 81   Temp 98 F (36.7 C) (Oral)   Resp 18   Ht '5\' 11"'$  (1.803 m)   Wt 120.2 kg   SpO2 94%   BMI 36.96 kg/m  Gen:   Awake, no distress   Resp:  Normal effort  MSK:   Moves extremities without difficulty  Other:  TTP on left side of abdomen  Medical Decision Making  Medically screening exam initiated at 5:11 PM.  Appropriate orders placed.  Shadd Dunstan was informed that the remainder of the evaluation will be completed by another provider, this initial triage assessment does not replace that evaluation, and the importance of remaining in the ED until their evaluation is complete.  Abdominal labs CT abdomen   Suzy Bouchard, PA-C 02/05/22 1713

## 2022-02-05 NOTE — ED Triage Notes (Signed)
Pt needs IV for CT  

## 2022-02-06 ENCOUNTER — Emergency Department (HOSPITAL_COMMUNITY): Payer: 59 | Admitting: Certified Registered"

## 2022-02-06 ENCOUNTER — Encounter (HOSPITAL_COMMUNITY): Payer: Self-pay | Admitting: Urology

## 2022-02-06 ENCOUNTER — Other Ambulatory Visit: Payer: Self-pay

## 2022-02-06 ENCOUNTER — Emergency Department (HOSPITAL_BASED_OUTPATIENT_CLINIC_OR_DEPARTMENT_OTHER): Payer: 59 | Admitting: Certified Registered"

## 2022-02-06 ENCOUNTER — Encounter (HOSPITAL_COMMUNITY)
Admission: EM | Disposition: A | Discharge status: Home / Self Care | Payer: Self-pay | Source: Home / Self Care | Attending: Emergency Medicine

## 2022-02-06 ENCOUNTER — Emergency Department (HOSPITAL_COMMUNITY): Payer: 59

## 2022-02-06 DIAGNOSIS — N200 Calculus of kidney: Secondary | ICD-10-CM

## 2022-02-06 DIAGNOSIS — N132 Hydronephrosis with renal and ureteral calculous obstruction: Secondary | ICD-10-CM

## 2022-02-06 HISTORY — PX: CYSTOSCOPY W/ URETERAL STENT PLACEMENT: SHX1429

## 2022-02-06 LAB — URINALYSIS, ROUTINE W REFLEX MICROSCOPIC
Bacteria, UA: NONE SEEN
Bilirubin Urine: NEGATIVE
Glucose, UA: NEGATIVE mg/dL
Ketones, ur: NEGATIVE mg/dL
Leukocytes,Ua: NEGATIVE
Nitrite: NEGATIVE
Protein, ur: NEGATIVE mg/dL
Specific Gravity, Urine: 1.034 — ABNORMAL HIGH (ref 1.005–1.030)
pH: 5 (ref 5.0–8.0)

## 2022-02-06 SURGERY — CYSTOSCOPY, WITH RETROGRADE PYELOGRAM AND URETERAL STENT INSERTION
Anesthesia: General | Laterality: Bilateral

## 2022-02-06 MED ORDER — PROMETHAZINE HCL 25 MG/ML IJ SOLN: 6.2500 mg | INTRAMUSCULAR | Status: DC | PRN

## 2022-02-06 MED ORDER — PROPOFOL 10 MG/ML IV BOLUS
INTRAVENOUS | Status: AC
  Filled 2022-02-06: qty 20

## 2022-02-06 MED ORDER — ONDANSETRON HCL 4 MG/2ML IJ SOLN
INTRAMUSCULAR | Status: DC | PRN
  Administered 2022-02-06: 4 mg via INTRAVENOUS

## 2022-02-06 MED ORDER — ACETAMINOPHEN 10 MG/ML IV SOLN
INTRAVENOUS | Status: AC
  Filled 2022-02-06: qty 100

## 2022-02-06 MED ORDER — FENTANYL CITRATE (PF) 100 MCG/2ML IJ SOLN
INTRAMUSCULAR | Status: DC | PRN
  Administered 2022-02-06 (×2): 25 ug via INTRAVENOUS
  Administered 2022-02-06: 50 ug via INTRAVENOUS

## 2022-02-06 MED ORDER — KETOROLAC TROMETHAMINE 30 MG/ML IJ SOLN
INTRAMUSCULAR | Status: AC
  Filled 2022-02-06: qty 1

## 2022-02-06 MED ORDER — FENTANYL CITRATE (PF) 100 MCG/2ML IJ SOLN
INTRAMUSCULAR | Status: AC
  Filled 2022-02-06: qty 2

## 2022-02-06 MED ORDER — CEFAZOLIN SODIUM-DEXTROSE 1-4 GM/50ML-% IV SOLN
INTRAVENOUS | Status: AC
  Administered 2022-02-06: 1000 mg
  Filled 2022-02-06: qty 50

## 2022-02-06 MED ORDER — PROPOFOL 10 MG/ML IV BOLUS
INTRAVENOUS | Status: DC | PRN
  Administered 2022-02-06: 170 mg via INTRAVENOUS

## 2022-02-06 MED ORDER — LIDOCAINE 2% (20 MG/ML) 5 ML SYRINGE
INTRAMUSCULAR | Status: DC | PRN
  Administered 2022-02-06: 100 mg via INTRAVENOUS

## 2022-02-06 MED ORDER — MIDAZOLAM HCL 5 MG/5ML IJ SOLN
INTRAMUSCULAR | Status: DC | PRN
  Administered 2022-02-06: 2 mg via INTRAVENOUS

## 2022-02-06 MED ORDER — MORPHINE SULFATE (PF) 4 MG/ML IV SOLN
4.0000 mg | Freq: Once | INTRAVENOUS | Status: AC
  Administered 2022-02-06: 4 mg via INTRAVENOUS
  Filled 2022-02-06: qty 1

## 2022-02-06 MED ORDER — FENTANYL CITRATE PF 50 MCG/ML IJ SOSY: 25.0000 ug | PREFILLED_SYRINGE | INTRAMUSCULAR | Status: DC | PRN

## 2022-02-06 MED ORDER — LIDOCAINE HCL (PF) 2 % IJ SOLN
INTRAMUSCULAR | Status: AC
  Filled 2022-02-06: qty 5

## 2022-02-06 MED ORDER — MIDAZOLAM HCL 2 MG/2ML IJ SOLN
INTRAMUSCULAR | Status: AC
  Filled 2022-02-06: qty 2

## 2022-02-06 MED ORDER — SODIUM CHLORIDE 0.9 % IV SOLN: INTRAVENOUS | Status: DC

## 2022-02-06 MED ORDER — DIPHENHYDRAMINE HCL 50 MG/ML IJ SOLN: 12.5000 mg | Freq: Four times a day (QID) | INTRAMUSCULAR | Status: DC | PRN

## 2022-02-06 MED ORDER — DEXAMETHASONE SODIUM PHOSPHATE 10 MG/ML IJ SOLN
INTRAMUSCULAR | Status: AC
  Filled 2022-02-06: qty 1

## 2022-02-06 MED ORDER — DIPHENHYDRAMINE HCL 12.5 MG/5ML PO ELIX: 12.5000 mg | ORAL_SOLUTION | Freq: Four times a day (QID) | ORAL | Status: DC | PRN

## 2022-02-06 MED ORDER — ONDANSETRON HCL 4 MG/2ML IJ SOLN
INTRAMUSCULAR | Status: AC
  Filled 2022-02-06: qty 2

## 2022-02-06 MED ORDER — SODIUM CHLORIDE 0.9 % IR SOLN
Status: DC | PRN
  Administered 2022-02-06: 3000 mL via INTRAVESICAL

## 2022-02-06 MED ORDER — IOHEXOL 300 MG/ML  SOLN
INTRAMUSCULAR | Status: DC | PRN
  Administered 2022-02-06: 13 mL

## 2022-02-06 MED ORDER — ACETAMINOPHEN 10 MG/ML IV SOLN
INTRAVENOUS | Status: DC | PRN
  Administered 2022-02-06: 1000 mg via INTRAVENOUS

## 2022-02-06 MED ORDER — OXYCODONE HCL 5 MG PO TABS
5.0000 mg | ORAL_TABLET | ORAL | Status: DC | PRN
  Administered 2022-02-06 – 2022-02-07 (×2): 5 mg via ORAL
  Filled 2022-02-06 (×2): qty 1

## 2022-02-06 MED ORDER — GENTAMICIN SULFATE 40 MG/ML IJ SOLN
5.0000 mg/kg | INTRAVENOUS | Status: DC
  Administered 2022-02-06: 470 mg via INTRAVENOUS
  Filled 2022-02-06 (×2): qty 11.75

## 2022-02-06 MED ORDER — KETOROLAC TROMETHAMINE 30 MG/ML IJ SOLN
INTRAMUSCULAR | Status: DC | PRN
  Administered 2022-02-06: 30 mg via INTRAVENOUS

## 2022-02-06 MED ORDER — ONDANSETRON HCL 4 MG/2ML IJ SOLN: 4.0000 mg | INTRAMUSCULAR | Status: DC | PRN

## 2022-02-06 MED ORDER — CEFAZOLIN SODIUM-DEXTROSE 2-4 GM/100ML-% IV SOLN
INTRAVENOUS | Status: AC
  Administered 2022-02-06: 2000 mg
  Filled 2022-02-06: qty 100

## 2022-02-06 MED ORDER — LACTATED RINGERS IV SOLN: INTRAVENOUS | Status: DC | PRN

## 2022-02-06 MED ORDER — ACETAMINOPHEN 325 MG PO TABS
650.0000 mg | ORAL_TABLET | ORAL | Status: DC | PRN
  Administered 2022-02-07: 650 mg via ORAL
  Filled 2022-02-06: qty 2

## 2022-02-06 MED ORDER — LACTATED RINGERS IV BOLUS
1000.0000 mL | Freq: Once | INTRAVENOUS | Status: AC
  Administered 2022-02-06: 1000 mL via INTRAVENOUS

## 2022-02-06 MED ORDER — DEXAMETHASONE SODIUM PHOSPHATE 10 MG/ML IJ SOLN
INTRAMUSCULAR | Status: DC | PRN
  Administered 2022-02-06: 10 mg via INTRAVENOUS

## 2022-02-06 MED ORDER — ONDANSETRON HCL 4 MG/2ML IJ SOLN
4.0000 mg | Freq: Once | INTRAMUSCULAR | Status: AC
  Administered 2022-02-06: 4 mg via INTRAVENOUS
  Filled 2022-02-06: qty 2

## 2022-02-06 MED ORDER — CEFAZOLIN IN SODIUM CHLORIDE 3-0.9 GM/100ML-% IV SOLN
3.0000 g | INTRAVENOUS | Status: AC
  Administered 2022-02-06: 3 g via INTRAVENOUS

## 2022-02-06 SURGICAL SUPPLY — 14 items
BAG URO CATCHER STRL LF (MISCELLANEOUS) ×1 IMPLANT
BASKET ZERO TIP NITINOL 2.4FR (BASKET) IMPLANT
CATH URETL OPEN END 6FR 70 (CATHETERS) IMPLANT
CLOTH BEACON ORANGE TIMEOUT ST (SAFETY) ×1 IMPLANT
GLOVE SURG LX STRL 7.5 STRW (GLOVE) ×1 IMPLANT
GOWN SRG XL LVL 4 BRTHBL STRL (GOWNS) ×1 IMPLANT
GOWN STRL NON-REIN XL LVL4 (GOWNS) ×4
GUIDEWIRE ANG ZIPWIRE 038X150 (WIRE) ×1 IMPLANT
GUIDEWIRE STR DUAL SENSOR (WIRE) IMPLANT
MANIFOLD NEPTUNE II (INSTRUMENTS) ×1 IMPLANT
PACK CYSTO (CUSTOM PROCEDURE TRAY) ×1 IMPLANT
STENT POLARIS 5FRX26 (STENTS) IMPLANT
TUBING CONNECTING 10 (TUBING) ×1 IMPLANT
TUBING UROLOGY SET (TUBING) IMPLANT

## 2022-02-06 NOTE — Anesthesia Procedure Notes (Signed)
Procedure Name: LMA Insertion Date/Time: 02/06/2022 7:22 AM  Performed by: Marea Reasner D, CRNAPre-anesthesia Checklist: Patient identified, Emergency Drugs available, Suction available and Patient being monitored Patient Re-evaluated:Patient Re-evaluated prior to induction Oxygen Delivery Method: Circle system utilized Preoxygenation: Pre-oxygenation with 100% oxygen Induction Type: IV induction Ventilation: Mask ventilation without difficulty LMA: LMA inserted LMA Size: 5.0 Tube type: Oral Number of attempts: 1 Placement Confirmation: positive ETCO2 and breath sounds checked- equal and bilateral Tube secured with: Tape Dental Injury: Teeth and Oropharynx as per pre-operative assessment

## 2022-02-06 NOTE — Transfer of Care (Signed)
Immediate Anesthesia Transfer of Care Note  Patient: Andrew Hooper  Procedure(s) Performed: CYSTOSCOPY WITH RETROGRADE PYELOGRAM/URETERAL STENT PLACEMENT (Bilateral)  Patient Location: PACU  Anesthesia Type:General  Level of Consciousness: awake, alert  and oriented  Airway & Oxygen Therapy: Patient Spontanous Breathing and Patient connected to face mask oxygen  Post-op Assessment: Report given to RN and Post -op Vital signs reviewed and stable  Post vital signs: Reviewed and stable  Last Vitals:  Vitals Value Taken Time  BP 119/79 02/06/22 0752  Temp    Pulse 84 02/06/22 0756  Resp 10 02/06/22 0756  SpO2 98 % 02/06/22 0756  Vitals shown include unvalidated device data.  Last Pain:  Vitals:   02/06/22 0529  TempSrc:   PainSc: 7          Complications: No notable events documented.

## 2022-02-06 NOTE — Brief Op Note (Signed)
02/06/2022  7:44 AM  PATIENT:  Aundra Millet  56 y.o. male  PRE-OPERATIVE DIAGNOSIS:  NEPHROLITHIASIS  POST-OPERATIVE DIAGNOSIS:  NEPHROLITHIASIS  PROCEDURE:  Procedure(s): CYSTOSCOPY WITH RETROGRADE PYELOGRAM/URETERAL STENT PLACEMENT (Bilateral)  SURGEON:  Surgeon(s) and Role:    * Alexis Frock, MD - Primary  PHYSICIAN ASSISTANT:   ASSISTANTS: none   ANESTHESIA:   general  EBL:  minimal   BLOOD ADMINISTERED:none  DRAINS: none   LOCAL MEDICATIONS USED:  NONE  SPECIMEN:  No Specimen  DISPOSITION OF SPECIMEN:  N/A  COUNTS:  YES  TOURNIQUET:  * No tourniquets in log *  DICTATION: .Other Dictation: Dictation Number [id number]  PLAN OF CARE: Admit for overnight observation  PATIENT DISPOSITION:  PACU - hemodynamically stable.   Delay start of Pharmacological VTE agent (>24hrs) due to surgical blood loss or risk of bleeding: not applicable

## 2022-02-06 NOTE — Anesthesia Preprocedure Evaluation (Signed)
Anesthesia Evaluation  Patient identified by MRN, date of birth, ID band Patient awake    Reviewed: Allergy & Precautions, NPO status , Patient's Chart, lab work & pertinent test results  Airway Mallampati: III  TM Distance: >3 FB Neck ROM: Full    Dental  (+) Teeth Intact, Dental Advisory Given   Pulmonary sleep apnea and Continuous Positive Airway Pressure Ventilation ,    Pulmonary exam normal breath sounds clear to auscultation       Cardiovascular hypertension, Pt. on medications + Peripheral Vascular Disease  Normal cardiovascular exam Rhythm:Regular Rate:Normal     Neuro/Psych negative neurological ROS     GI/Hepatic Neg liver ROS, GERD  ,  Endo/Other  negative endocrine ROS  Renal/GU negative Renal ROS     Musculoskeletal negative musculoskeletal ROS (+)   Abdominal   Peds  Hematology negative hematology ROS (+)   Anesthesia Other Findings Day of surgery medications reviewed with the patient.  Reproductive/Obstetrics                             Anesthesia Physical Anesthesia Plan  ASA: 2  Anesthesia Plan: General   Post-op Pain Management: Toradol IV (intra-op)* and Ofirmev IV (intra-op)*   Induction: Intravenous  PONV Risk Score and Plan: 3 and Midazolam, Dexamethasone and Ondansetron  Airway Management Planned: LMA  Additional Equipment:   Intra-op Plan:   Post-operative Plan: Extubation in OR  Informed Consent: I have reviewed the patients History and Physical, chart, labs and discussed the procedure including the risks, benefits and alternatives for the proposed anesthesia with the patient or authorized representative who has indicated his/her understanding and acceptance.     Dental advisory given  Plan Discussed with: CRNA  Anesthesia Plan Comments:         Anesthesia Quick Evaluation

## 2022-02-06 NOTE — Anesthesia Postprocedure Evaluation (Signed)
Anesthesia Post Note  Patient: Andrew Hooper  Procedure(s) Performed: CYSTOSCOPY WITH RETROGRADE PYELOGRAM/URETERAL STENT PLACEMENT (Bilateral)     Patient location during evaluation: PACU Anesthesia Type: General Level of consciousness: awake and alert Pain management: pain level controlled Vital Signs Assessment: post-procedure vital signs reviewed and stable Respiratory status: spontaneous breathing, nonlabored ventilation, respiratory function stable and patient connected to nasal cannula oxygen Cardiovascular status: blood pressure returned to baseline and stable Postop Assessment: no apparent nausea or vomiting Anesthetic complications: no   No notable events documented.  Last Vitals:  Vitals:   02/06/22 0830 02/06/22 0900  BP: 119/74 109/76  Pulse: 70 66  Resp: 10 12  Temp: 36.6 C 36.6 C  SpO2: 97% 97%    Last Pain:  Vitals:   02/06/22 0900  TempSrc:   PainSc: 0-No pain                 Santa Lighter

## 2022-02-06 NOTE — Op Note (Signed)
NAMEIRVING, BLOOR MEDICAL RECORD NO: [id number] ACCOUNT NO: [id number] DATE OF BIRTH: 1965-08-02 FACILITY: [facility] LOCATION: [facility] PHYSICIAN: Alexis Frock, MD  Operative Report   DATE OF PROCEDURE: 02/06/2022  PREOPERATIVE DIAGNOSIS:  Left ureteral, bilateral renal stones.  PROCEDURE PERFORMED: 1.  Cystoscopy with bilateral retrograde pyelograms interpretation. 2.  Insertion of bilateral ureteral stents.  ESTIMATED BLOOD LOSS:  Nil.  COMPLICATIONS:  None.  SPECIMEN:  None.  FINDINGS: 1.  Left proximal ureteral stone with moderate hydronephrosis. 2.  Successful placement of bilateral ureteral stents.  No tether.  INDICATIONS:  The patient is a pleasant 56 year old man who was found on workup of abdominal pain and flank pain to have a left large proximal ureteral stone, approximately 1.3 cm.  There was moderate hydronephrosis.  This does appear somewhat chronic.   He does have bilateral nonobstructing renal stones as well as colic difficult to control in the emergency room.  He did have some significant subjective chills.  Options were discussed for management including observation versus stenting and then  definitive ureteroscopy in elective setting with goal of stone free.  He wished to proceed with the latter, it was felt that a staged approach would be most prudent as he does have some borderline infectious parameters.  Informed consent was obtained and  placed in medical record.  PROCEDURE IN DETAIL:  The patient being verified, procedure being cystoscopy and bilateral stent placement confirmed.  Procedure timeout was performed.  Intravenous antibiotics were administered and general LMA anesthesia induced.  The patient was placed  into a low lithotomy position.  Sterile field was created, prepped and draped the patient's penis, perineum, and proximal thighs using iodine.  Cystourethroscopy was performed using a rigid cystoscope with offset lens.  Inspection of anterior and   posterior urethra was unremarkable.  Inspection of urinary bladder revealed no diverticula, calcifications, papillary lesions.  The left ureteral orifice was cannulated with a 6-French end-hole catheter and left retrograde pyelogram was obtained.  Left retrograde pyelogram demonstrated a single left ureter, single system left kidney.  There was a filling defect in the proximal ureter, UPJ area consistent with known stone.  There was moderate hydronephrosis above this.  A 0.038 ZIPwire was advanced  to the level of the upper pole over which a new 5 x 26 Polaris type stent was placed using fluoroscopic guidance.  Good proximal and distal plane were noted.  Next, right retrograde pyelogram was obtained.  Right retrograde pyelogram demonstrated single right ureter, single system right kidney.  No filling defects or narrowing noted.   ZIPwire was advanced and a 5 x 26 Polaris type stent was advanced under cystoscopic and fluoroscopic guidance.  Good  proximal and distal plane were noted.  Bladder was emptied per cystoscope.  Procedure was then terminated.  The patient tolerated the procedure well, no immediate perioperative complications.  The patient taken to postanesthesia care unit in stable  condition.  Plan for observation admission, likely discharge home later today as long as he does not develop acute infectious parameters.  He will then be scheduled for bilateral ureteroscopy in elective setting with goal of stone free.   SHW D: 02/06/2022 7:48:18 am T: 02/06/2022 8:56:00 am  JOB: [id number]/ [id number]

## 2022-02-06 NOTE — Consult Note (Signed)
Urology Consult   Physician requesting consult: Dr. Veatrice Kells, MD  Reason for consult: Obstructing urolithiasis  History of Present Illness: Andrew Hooper is a 56 y.o. male with a history of diverticulitis, GERD, hypertension, nephrolithiasis who presented to the emergency department with persistent left-sided flank pain.  He notes that his flank pain started in the afternoon on 02/04/2022.  He initially thought he was having gas pains and tried to take pain medication and gas medication but did not have any improvement in his pain.  He also notes that he has been having chills at home and in the emergency department.  Denies any fevers.  Denies any gross hematuria.  In the emergency department, he is afebrile and hemodynamically stable.  Labs notable for leukocytosis to 14.1, hemoglobin 15.2, creatinine 1.05 from baseline of 0.8.  Urinalysis with negative leukocyte esterase, negative nitrite, large blood and microscopic hematuria with pyuria.  Subsequent CT imaging 1.3 cm left proximal ureteral obstructing stone as well as smaller stone in the left lower pole and right lower pole.  The patient notes that he was diagnosed with kidney stones incidentally on imaging a few years ago.  He notes that he felt like he passed a small stone fragment approximately a year ago but has never seen a urologist.  Denies any family history of nephrolithiasis.  Past Medical History:  Diagnosis Date   Complication of anesthesia    rash afterwords"   Diverticulitis    GERD (gastroesophageal reflux disease)    Hypertension    Reflux     Past Surgical History:  Procedure Laterality Date   BACK SURGERY     BACK SURGERY     CHOLECYSTECTOMY N/A 02/02/2020   Procedure: LAPAROSCOPIC CHOLECYSTECTOMY WITH INTRAOPERATIVE CHOLANGIOGRAM;  Surgeon: Kinsinger, Arta Bruce, MD;  Location: [facility];  Service: General;  Laterality: N/A;   [facility] Medications:  Home Meds:  Current Meds  Medication Sig   Cholecalciferol 50 MCG (2000 UT) CAPS Take 2,000 Units by mouth daily.   escitalopram (LEXAPRO) 10 MG tablet Take 10 mg by mouth daily.   ibuprofen (ADVIL) 800 MG tablet Take 1 tablet (800 mg total) by mouth every 6 (six) hours as needed.   irbesartan (AVAPRO) 300 MG tablet Take 1 tablet (300 mg total) by mouth daily. Call and schedule follow up appointment for further refills. [phone]. 1st attempt   naproxen sodium (ALEVE) 220 MG tablet Take 220 mg by mouth 2 (two) times daily as needed (for back pain).   rosuvastatin (CRESTOR) 10 MG tablet TAKE 1 TABLET(10 MG) BY MOUTH DAILY (Patient taking differently: Take 10 mg by mouth daily.)   zolpidem (AMBIEN) 10 MG tablet Take 10 mg by mouth at bedtime as needed for sleep.    Scheduled Meds:  sodium chloride (PF)       Continuous Infusions: PRN Meds:.sodium chloride (PF)  Allergies:  Allergies  Allergen Reactions   Sulfamethoxazole Rash    Sulfa based antibiotics     Family History  Problem Relation Age of Onset   Breast cancer Mother    Rectal cancer Mother    Deep vein thrombosis Mother    Stroke Father    Heart disease Father 9   Heart disease Brother 71    Social History:  reports that he has never smoked. He has  never used smokeless tobacco. He reports current alcohol use. He reports that he does not use drugs.  ROS: A complete review of systems was performed.  All systems are negative except for pertinent findings as noted.  Physical Exam:  Vital signs in last 24 hours: Temp:  [98 F (36.7 C)] 98 F (36.7 C) (09/06 0015) Pulse Rate:  [66-81] 67 (09/06 0230) Resp:  [12-20] 14 (09/06 0230) BP: (123-152)/(72-98) 126/77 (09/06 0230) SpO2:  [93 %-97 %] 93 % (09/06 0230) Weight:  [120.2 kg] 120.2 kg (09/05 1703) Constitutional:  Alert and oriented, No acute distress Cardiovascular: Regular rate and rhythm, No JVD Respiratory:  Normal respiratory effort, Lungs clear bilaterally GI: Abdomen is soft, TTP in left abdomen, nondistended, no abdominal masses GU: Left CVA tenderness Neurologic: Grossly intact, no focal deficits Psychiatric: Normal mood and affect  Laboratory Data:  Recent Labs    02/05/22 1732  WBC 14.1*  HGB 15.2  HCT 47.5  PLT 244    Recent Labs    02/05/22 1732  NA 140  K 3.8  CL 106  GLUCOSE 109*  BUN 29*  CALCIUM 9.0  CREATININE 1.05     Results for orders placed or performed during the hospital encounter of 02/05/22 (from the past 24 hour(s))  CBC with Differential     Status: Abnormal   Collection Time: 02/05/22  5:32 PM  Result Value Ref Range   WBC 14.1 (H) 4.0 - 10.5 K/uL   RBC 5.25 4.22 - 5.81 MIL/uL   Hemoglobin 15.2 13.0 - 17.0 g/dL   HCT 47.5 39.0 - 52.0 %   MCV 90.5 80.0 - 100.0 fL   MCH 29.0 26.0 - 34.0 pg   MCHC 32.0 30.0 - 36.0 g/dL   RDW 14.6 11.5 - 15.5 %   Platelets 244 150 - 400 K/uL   nRBC 0.0 0.0 - 0.2 %   Neutrophils Relative % 80 %   Neutro Abs 11.1 (H) 1.7 - 7.7 K/uL   Lymphocytes Relative 12 %   Lymphs Abs 1.7 0.7 - 4.0 K/uL   Monocytes Relative 7 %   Monocytes Absolute 1.0 0.1 - 1.0 K/uL   Eosinophils Relative 1 %   Eosinophils Absolute 0.2 0.0 - 0.5 K/uL   Basophils Relative 0 %   Basophils Absolute 0.1 0.0 - 0.1 K/uL   Immature Granulocytes 0 %   Abs Immature Granulocytes 0.06 0.00 - 0.07 K/uL  Comprehensive metabolic panel     Status: Abnormal   Collection Time: 02/05/22  5:32 PM  Result Value Ref Range   Sodium 140 135 - 145 mmol/L   Potassium 3.8 3.5 - 5.1 mmol/L   Chloride 106 98 - 111 mmol/L   CO2 25 22 - 32 mmol/L   Glucose, Bld 109 (H) 70 - 99 mg/dL   BUN 29 (H) 6 - 20 mg/dL   Creatinine, Ser 1.05 0.61 - 1.24 mg/dL   Calcium 9.0 8.9 - 10.3 mg/dL   Total Protein 7.4 6.5 - 8.1 g/dL   Albumin 4.3 3.5 - 5.0 g/dL   AST 17 15 - 41 U/L   ALT 21 0 - 44 U/L   Alkaline Phosphatase 71 38 - 126 U/L   Total Bilirubin 0.7 0.3 - 1.2  mg/dL   GFR, Estimated >60 >60 mL/min   Anion gap 9 5 - 15  Lipase, blood     Status: None   Collection Time: 02/05/22  5:32 PM  Result Value Ref Range   Lipase 28  11 - 51 U/L  Urinalysis, Routine w reflex microscopic Urine, Clean Catch     Status: Abnormal   Collection Time: 02/06/22 12:53 AM  Result Value Ref Range   Color, Urine YELLOW YELLOW   APPearance CLEAR CLEAR   Specific Gravity, Urine 1.034 (H) 1.005 - 1.030   pH 5.0 5.0 - 8.0   Glucose, UA NEGATIVE NEGATIVE mg/dL   Hgb urine dipstick LARGE (A) NEGATIVE   Bilirubin Urine NEGATIVE NEGATIVE   Ketones, ur NEGATIVE NEGATIVE mg/dL   Protein, ur NEGATIVE NEGATIVE mg/dL   Nitrite NEGATIVE NEGATIVE   Leukocytes,Ua NEGATIVE NEGATIVE   RBC / HPF 11-20 0 - 5 RBC/hpf   WBC, UA 11-20 0 - 5 WBC/hpf   Bacteria, UA NONE SEEN NONE SEEN   Mucus PRESENT    No results found for this or any previous visit (from the past 240 hour(s)).  Renal Function: Recent Labs    02/05/22 1732  CREATININE 1.05   Estimated Creatinine Clearance: 101.2 mL/min (by C-G formula based on SCr of 1.05 mg/dL).  Radiologic Imaging: CT ABDOMEN PELVIS W CONTRAST  Result Date: 02/06/2022 CLINICAL DATA:  Left lower quadrant abdominal pain EXAM: CT ABDOMEN AND PELVIS WITH CONTRAST TECHNIQUE: Multidetector CT imaging of the abdomen and pelvis was performed using the standard protocol following bolus administration of intravenous contrast. RADIATION DOSE REDUCTION: This exam was performed according to the departmental dose-optimization program which includes automated exposure control, adjustment of the mA and/or kV according to patient size and/or use of iterative reconstruction technique. CONTRAST:  121m OMNIPAQUE IOHEXOL 300 MG/ML  SOLN COMPARISON:  01/31/2020 FINDINGS: Lower chest: No acute abnormality. Hepatobiliary: Moderate hepatic steatosis. Stable cyst within the left hepatic lobe. No enhancing intrahepatic mass. No intra or extrahepatic biliary ductal  dilation. Status post cholecystectomy. Pancreas: Unremarkable Spleen: Unremarkable Adrenals/Urinary Tract: The adrenal glands are unremarkable. The kidneys are normal in size and position. Moderate left hydronephrosis has developed secondary to an obstructing 9 x 10 x 13 mm calculus within the left ureteropelvic junction. There is superimposed bilateral nonobstructing nephrolithiasis with multiple calculi measuring up to 6 mm within the kidneys bilaterally. No hydronephrosis on the right. No additional ureteral calculi. The bladder is unremarkable. Stomach/Bowel: Status post sigmoid colectomy. Stomach, small bowel, and large bowel are otherwise unremarkable. Appendix normal. No free intraperitoneal gas or fluid. Vascular/Lymphatic: Aortic atherosclerosis. No enlarged abdominal or pelvic lymph nodes. Reproductive: Prostate is unremarkable. Other: Tiny fat containing bilateral inguinal hernia Musculoskeletal: Degenerative changes are seen within the visualized thoracolumbar spine. Lytic lesion compatible with a probable vertebral hemangioma noted within the L1 vertebral body. No acute bone abnormality. No suspicious lytic or blastic bone lesion. IMPRESSION: 1. Obstructing 9 x 10 x 13 mm calculus within the left ureteropelvic junction resulting in moderate left hydronephrosis. Superimposed bilateral nonobstructing nephrolithiasis. 2. Moderate hepatic steatosis. 3. Status post sigmoid colectomy. Aortic Atherosclerosis (ICD10-I70.0). Electronically Signed   By: AFidela SalisburyM.D.   On: 02/06/2022 00:10    I independently reviewed the above imaging studies.  Impression/Recommendation 56year old male with a history of hypertension, GERD, diverticulitis presenting with left-sided flank pain and found to have 1.3 cm left obstructing UPJ stone and bilateral nonobstructing stones. Afebrile and hemodynamically stable.  Leukocytosis to 14, creatinine 1.05 from baseline of 0.8.  Urinalysis unremarkable. Reviewed CT  imaging which showed the above findings. Patient reports chills at home and while he has been in the emergency department.  Discussed the indications for surgical intervention at this time.  Discussed that due  to persistent pain with leukocytosis in setting of chills, would recommend decompression of left kidney with ureteral stent.  Since he also has nonobstructing stones on the right side recommend placing right ureteral stent to help facilitate his subsequent definitive stone procedure. Discussed the risks and benefits in details.  Patient agrees with plan.   Josph Macho, MD Resident Physician [facility] 02/06/2022, 3:02 AM

## 2022-02-06 NOTE — Progress Notes (Signed)
Pharmacy Antibiotic Note  Andrew Hooper is a 56 y.o. male who presented to the ED on 02/05/2022 with c/o abdominal pain. Abdominal CT on 02/05/22 showed obstructing stone in the left ureteropelvic junction resulting in moderate left hydronephrosis.  He underwent cystoscopy with ureteral stent placement on 02/06/22.  Pharmacy has been consulted to dose gentamicin for UTI.  Plan: - gentamicin 470 mg IV dailty (~5 mg/kg/day using ABW) - check 10 hr gentamicin level  ___________________________________  Height: '5\' 11"'$  (180.3 cm) Weight: 120.2 kg (265 lb) IBW/kg (Calculated) : 75.3  Temp (24hrs), Avg:98.1 F (36.7 C), Min:97.8 F (36.6 C), Max:99 F (37.2 C)  Recent Labs  Lab 02/05/22 1732  WBC 14.1*  CREATININE 1.05    Estimated Creatinine Clearance: 101.2 mL/min (by C-G formula based on SCr of 1.05 mg/dL).    Allergies  Allergen Reactions   Sulfamethoxazole Rash    Sulfa based antibiotics      Thank you for allowing pharmacy to be a part of this patient's care.  Lynelle Doctor 02/06/2022 3:05 PM

## 2022-02-06 NOTE — H&P (Signed)
Andrew Hooper is an 56 y.o. male.    Chief Complaint: Left Ureteral / Bilateral Renal Stones  HPI:   1 - Left Ureteral / Bilateral Renal Stones - Lef 1.3cm UPJ with mod hydro and bilateral renal stones on ER CT on eval abd pain. Some subjective chills. WBC 14. No fevers. UA without significant infectiosu parameters.  PMH sig for lap chole, segmental colon resection (diverticulitis). He is in [employer] for [employer], lived previously in [place]. His PCP is Arnold Long MD.  Today "Andrew Hooper" is seen for evaluation of above. He has been in ER overnight and colic remains difficult to control.   Past Medical History:  Diagnosis Date   Complication of anesthesia    rash afterwords"   Diverticulitis    GERD (gastroesophageal reflux disease)    Hypertension    Reflux     Past Surgical History:  Procedure Laterality Date   BACK SURGERY     BACK SURGERY     CHOLECYSTECTOMY N/A 02/02/2020   Procedure: LAPAROSCOPIC CHOLECYSTECTOMY WITH INTRAOPERATIVE CHOLANGIOGRAM;  Surgeon: Kinsinger, Arta Bruce, MD;  Location: [facility];  Service: General;  Laterality: N/A;   COLON SURGERY     COLON SURGERY     KNEE SURGERY     KNEE SURGERY     SHOULDER SURGERY      Family History  Problem Relation Age of Onset   Breast cancer Mother    Rectal cancer Mother    Deep vein thrombosis Mother    Stroke Father    Heart disease Father 30   Heart disease Brother 71   Social History:  reports that he has never smoked. He has never used smokeless tobacco. He reports current alcohol use. He reports that he does not use drugs.  Allergies:  Allergies  Allergen Reactions   Sulfamethoxazole Rash    Sulfa based antibiotics     (Not in a hospital admission)   Results for orders placed or performed during the hospital encounter of 02/05/22 (from the past 48 hour(s))  CBC with Differential     Status: Abnormal   Collection Time: 02/05/22  5:32 PM  Result Value Ref Range   WBC 14.1 (H) 4.0 - 10.5 K/uL    RBC 5.25 4.22 - 5.81 MIL/uL   Hemoglobin 15.2 13.0 - 17.0 g/dL   HCT 47.5 39.0 - 52.0 %   MCV 90.5 80.0 - 100.0 fL   MCH 29.0 26.0 - 34.0 pg   MCHC 32.0 30.0 - 36.0 g/dL   RDW 14.6 11.5 - 15.5 %   Platelets 244 150 - 400 K/uL   nRBC 0.0 0.0 - 0.2 %   Neutrophils Relative % 80 %   Neutro Abs 11.1 (H) 1.7 - 7.7 K/uL   Lymphocytes Relative 12 %   Lymphs Abs 1.7 0.7 - 4.0 K/uL   Monocytes Relative 7 %   Monocytes Absolute 1.0 0.1 - 1.0 K/uL   Eosinophils Relative 1 %   Eosinophils Absolute 0.2 0.0 - 0.5 K/uL   Basophils Relative 0 %   Basophils Absolute 0.1 0.0 - 0.1 K/uL   Immature Granulocytes 0 %   Abs Immature Granulocytes 0.06 0.00 - 0.07 K/uL    Comment: Performed at [facility], [place] [address], [place], [place] [address]  Comprehensive metabolic panel     Status: Abnormal   Collection Time: 02/05/22  5:32 PM  Result Value Ref Range   Sodium 140 135 - 145 mmol/L   Potassium 3.8 3.5 -  5.1 mmol/L   Chloride 106 98 - 111 mmol/L   CO2 25 22 - 32 mmol/L   Glucose, Bld 109 (H) 70 - 99 mg/dL    Comment: Glucose reference range applies only to samples taken after fasting for at least 8 hours.   BUN 29 (H) 6 - 20 mg/dL   Creatinine, Ser 1.05 0.61 - 1.24 mg/dL   Calcium 9.0 8.9 - 10.3 mg/dL   Total Protein 7.4 6.5 - 8.1 g/dL   Albumin 4.3 3.5 - 5.0 g/dL   AST 17 15 - 41 U/L   ALT 21 0 - 44 U/L   Alkaline Phosphatase 71 38 - 126 U/L   Total Bilirubin 0.7 0.3 - 1.2 mg/dL   GFR, Estimated >60 >60 mL/min    Comment: (NOTE) Calculated using the CKD-EPI Creatinine Equation (2021)    Anion gap 9 5 - 15    Comment: Performed at [facility], [place] [address] [place], [place] [address]  Lipase, blood     Status: None   Collection Time: 02/05/22  5:32 PM  Result Value Ref Range   Lipase 28 11 - 51 U/L    Comment: Performed at [facility], [place] [address] [place], [place] [address]  Urinalysis, Routine w reflex microscopic  Urine, Clean Catch     Status: Abnormal   Collection Time: 02/06/22 12:53 AM  Result Value Ref Range   Color, Urine YELLOW YELLOW   APPearance CLEAR CLEAR   Specific Gravity, Urine 1.034 (H) 1.005 - 1.030   pH 5.0 5.0 - 8.0   Glucose, UA NEGATIVE NEGATIVE mg/dL   Hgb urine dipstick LARGE (A) NEGATIVE   Bilirubin Urine NEGATIVE NEGATIVE   Ketones, ur NEGATIVE NEGATIVE mg/dL   Protein, ur NEGATIVE NEGATIVE mg/dL   Nitrite NEGATIVE NEGATIVE   Leukocytes,Ua NEGATIVE NEGATIVE   RBC / HPF 11-20 0 - 5 RBC/hpf   WBC, UA 11-20 0 - 5 WBC/hpf   Bacteria, UA NONE SEEN NONE SEEN   Mucus PRESENT     Comment: Performed at [facility], [place] [address], [place], [place] [address]   CT ABDOMEN PELVIS W CONTRAST  Result Date: 02/06/2022 CLINICAL DATA:  Left lower quadrant abdominal pain EXAM: CT ABDOMEN AND PELVIS WITH CONTRAST TECHNIQUE: Multidetector CT imaging of the abdomen and pelvis was performed using the standard protocol following bolus administration of intravenous contrast. RADIATION DOSE REDUCTION: This exam was performed according to the departmental dose-optimization program which includes automated exposure control, adjustment of the mA and/or kV according to patient size and/or use of iterative reconstruction technique. CONTRAST:  144m OMNIPAQUE IOHEXOL 300 MG/ML  SOLN COMPARISON:  01/31/2020 FINDINGS: Lower chest: No acute abnormality. Hepatobiliary: Moderate hepatic steatosis. Stable cyst within the left hepatic lobe. No enhancing intrahepatic mass. No intra or extrahepatic biliary ductal dilation. Status post cholecystectomy. Pancreas: Unremarkable Spleen: Unremarkable Adrenals/Urinary Tract: The adrenal glands are unremarkable. The kidneys are normal in size and position. Moderate left hydronephrosis has developed secondary to an obstructing 9 x 10 x 13 mm calculus within the left ureteropelvic junction. There is superimposed bilateral nonobstructing nephrolithiasis with  multiple calculi measuring up to 6 mm within the kidneys bilaterally. No hydronephrosis on the right. No additional ureteral calculi. The bladder is unremarkable. Stomach/Bowel: Status post sigmoid colectomy. Stomach, small bowel, and large bowel are otherwise unremarkable. Appendix normal. No free intraperitoneal gas or fluid. Vascular/Lymphatic: Aortic atherosclerosis. No enlarged abdominal or pelvic lymph nodes. Reproductive: Prostate is unremarkable. Other: Tiny  fat containing bilateral inguinal hernia Musculoskeletal: Degenerative changes are seen within the visualized thoracolumbar spine. Lytic lesion compatible with a probable vertebral hemangioma noted within the L1 vertebral body. No acute bone abnormality. No suspicious lytic or blastic bone lesion. IMPRESSION: 1. Obstructing 9 x 10 x 13 mm calculus within the left ureteropelvic junction resulting in moderate left hydronephrosis. Superimposed bilateral nonobstructing nephrolithiasis. 2. Moderate hepatic steatosis. 3. Status post sigmoid colectomy. Aortic Atherosclerosis (ICD10-I70.0). Electronically Signed   By: Fidela Salisbury M.D.   On: 02/06/2022 00:10    Review of Systems  Constitutional:  Positive for chills. Negative for fever.  Genitourinary:  Positive for flank pain.  All other systems reviewed and are negative.   Blood pressure 135/85, pulse 61, temperature 98 F (36.7 C), resp. rate 15, height '5\' 11"'$  (1.803 m), weight 120.2 kg, SpO2 95 %. Physical Exam Vitals reviewed.  HENT:     Head: Normocephalic.  Cardiovascular:     Rate and Rhythm: Normal rate.  Pulmonary:     Effort: Pulmonary effort is normal.  Abdominal:     General: Abdomen is flat.  Genitourinary:    Comments: Mild Left CVAT Skin:    General: Skin is warm.  Neurological:     Mental Status: He is alert.  Psychiatric:        Mood and Affect: Mood normal.      Assessment/Plan  Proceed as discussed with cysto, bilateral cysts for renal decompression, then  will need bilateral uretroscopy in elective setting to achieve stone free. Rec observe post-op until afternoon to verify no infectious parameters, then likely DC.  Risks, benefits, alternatives (neph tube, observation), expected peri-op course discussed.   Alexis Frock, MD 02/06/2022, 6:28 AM

## 2022-02-06 NOTE — ED Provider Notes (Signed)
[facility] DEPT Provider Note   CSN: [id number] Arrival date & time: 02/05/22  1651     History  Chief Complaint  Patient presents with   Abdominal Pain    Quindon Denker is a 56 y.o. male who presents with concern for left upper and lower abdominal pain that started yesterday worsening in severity since that time.  Associated nausea and chills but no vomiting or diarrhea.  Has history of diverticulitis on that side but states this feels different.  States it feels similar to his gallbladder related pain in the past just on the other side.  No dysuria, hematuria, urinary frequency or urgency.  No history of kidney stone.  I personally read his medical records.  History of OSA, hypertension, GERD.  He is not on any anticoagulation.  HPI     Home Medications Prior to Admission medications   Medication Sig Start Date End Date Taking? Authorizing Provider  Cholecalciferol 50 MCG (2000 UT) CAPS Take 2,000 Units by mouth daily.   Yes [provider]  escitalopram (LEXAPRO) 10 MG tablet Take 10 mg by mouth daily. 11/12/19  Yes [provider]  ibuprofen (ADVIL) 800 MG tablet Take 1 tablet (800 mg total) by mouth every 6 (six) hours as needed. 10/01/21  Yes Felipa Furnace, DPM  irbesartan (AVAPRO) 300 MG tablet Take 1 tablet (300 mg total) by mouth daily. Call and schedule follow up appointment for further refills. [phone]. 1st attempt 10/22/21  Yes Jettie Booze, MD  naproxen sodium (ALEVE) 220 MG tablet Take 220 mg by mouth 2 (two) times daily as needed (for back pain).   Yes [provider]  rosuvastatin (CRESTOR) 10 MG tablet TAKE 1 TABLET(10 MG) BY MOUTH DAILY Patient taking differently: Take 10 mg by mouth daily. 04/10/21  Yes Jettie Booze, MD  zolpidem (AMBIEN) 10 MG tablet Take 10 mg by mouth at bedtime as needed for sleep. 12/29/19  Yes [provider]      Allergies    Sulfamethoxazole     Review of Systems   Review of Systems  Constitutional:  Positive for appetite change and chills. Negative for fever.  HENT: Negative.    Gastrointestinal:  Positive for abdominal pain and nausea. Negative for diarrhea and vomiting.  Genitourinary: Negative.  Negative for decreased urine volume, hematuria and urgency.  Musculoskeletal: Negative.   Skin: Negative.     Physical Exam Updated Vital Signs BP (!) 143/87   Pulse 73   Temp 98 F (36.7 C)   Resp 17   Ht '5\' 11"'$  (1.803 m)   Wt 120.2 kg   SpO2 98%   BMI 36.96 kg/m  Physical Exam Vitals and nursing note reviewed.  Constitutional:      Appearance: He is obese. He is not ill-appearing or toxic-appearing.  HENT:     Head: Normocephalic and atraumatic.     Mouth/Throat:     Mouth: Mucous membranes are moist.     Pharynx: No oropharyngeal exudate or posterior oropharyngeal erythema.  Eyes:     General:        Right eye: No discharge.        Left eye: No discharge.     Conjunctiva/sclera: Conjunctivae normal.  Cardiovascular:     Rate and Rhythm: Normal rate and regular rhythm.     Pulses: Normal pulses.     Heart sounds: Normal heart sounds. No murmur heard. Pulmonary:     Effort: Pulmonary effort is normal. No  respiratory distress.     Breath sounds: Normal breath sounds. No wheezing or rales.  Abdominal:     General: Bowel sounds are normal. There is no distension.     Palpations: Abdomen is soft.     Tenderness: There is abdominal tenderness in the suprapubic area, left upper quadrant and left lower quadrant. There is no right CVA tenderness, left CVA tenderness, guarding or rebound.     Hernia: No hernia is present.  Musculoskeletal:        General: No deformity.     Cervical back: Neck supple.  Skin:    General: Skin is warm and dry.     Capillary Refill: Capillary refill takes less than 2 seconds.  Neurological:     General: No focal deficit present.     Mental Status: He is alert and oriented to  person, place, and time. Mental status is at baseline.  Psychiatric:        Mood and Affect: Mood normal.     ED Results / Procedures / Treatments   Labs (all labs ordered are listed, but only abnormal results are displayed) Labs Reviewed  CBC WITH DIFFERENTIAL/PLATELET - Abnormal; Notable for the following components:      Result Value   WBC 14.1 (*)    Neutro Abs 11.1 (*)    All other components within normal limits  COMPREHENSIVE METABOLIC PANEL - Abnormal; Notable for the following components:   Glucose, Bld 109 (*)    BUN 29 (*)    All other components within normal limits  URINALYSIS, ROUTINE W REFLEX MICROSCOPIC - Abnormal; Notable for the following components:   Specific Gravity, Urine 1.034 (*)    Hgb urine dipstick LARGE (*)    All other components within normal limits  LIPASE, BLOOD    EKG EKG Interpretation  Date/Time:  Wednesday February 06 2022 00:50:33 EDT Ventricular Rate:  70 PR Interval:  197 QRS Duration: 92 QT Interval:  381 QTC Calculation: 412 R Axis:   56 Text Interpretation: Sinus rhythm Confirmed by Randal Buba, April ([id number]) on 02/06/2022 12:52:49 AM  Radiology CT ABDOMEN PELVIS W CONTRAST  Result Date: 02/06/2022 CLINICAL DATA:  Left lower quadrant abdominal pain EXAM: CT ABDOMEN AND PELVIS WITH CONTRAST TECHNIQUE: Multidetector CT imaging of the abdomen and pelvis was performed using the standard protocol following bolus administration of intravenous contrast. RADIATION DOSE REDUCTION: This exam was performed according to the departmental dose-optimization program which includes automated exposure control, adjustment of the mA and/or kV according to patient size and/or use of iterative reconstruction technique. CONTRAST:  171m OMNIPAQUE IOHEXOL 300 MG/ML  SOLN COMPARISON:  01/31/2020 FINDINGS: Lower chest: No acute abnormality. Hepatobiliary: Moderate hepatic steatosis. Stable cyst within the left hepatic lobe. No enhancing intrahepatic mass. No intra  or extrahepatic biliary ductal dilation. Status post cholecystectomy. Pancreas: Unremarkable Spleen: Unremarkable Adrenals/Urinary Tract: The adrenal glands are unremarkable. The kidneys are normal in size and position. Moderate left hydronephrosis has developed secondary to an obstructing 9 x 10 x 13 mm calculus within the left ureteropelvic junction. There is superimposed bilateral nonobstructing nephrolithiasis with multiple calculi measuring up to 6 mm within the kidneys bilaterally. No hydronephrosis on the right. No additional ureteral calculi. The bladder is unremarkable. Stomach/Bowel: Status post sigmoid colectomy. Stomach, small bowel, and large bowel are otherwise unremarkable. Appendix normal. No free intraperitoneal gas or fluid. Vascular/Lymphatic: Aortic atherosclerosis. No enlarged abdominal or pelvic lymph nodes. Reproductive: Prostate is unremarkable. Other: Tiny fat containing bilateral inguinal hernia Musculoskeletal: Degenerative changes  are seen within the visualized thoracolumbar spine. Lytic lesion compatible with a probable vertebral hemangioma noted within the L1 vertebral body. No acute bone abnormality. No suspicious lytic or blastic bone lesion. IMPRESSION: 1. Obstructing 9 x 10 x 13 mm calculus within the left ureteropelvic junction resulting in moderate left hydronephrosis. Superimposed bilateral nonobstructing nephrolithiasis. 2. Moderate hepatic steatosis. 3. Status post sigmoid colectomy. Aortic Atherosclerosis (ICD10-I70.0). Electronically Signed   By: Fidela Salisbury M.D.   On: 02/06/2022 00:10    Procedures Procedures    Medications Ordered in ED Medications  sodium chloride (PF) 0.9 % injection (  Not Given 02/05/22 2328)  iohexol (OMNIPAQUE) 300 MG/ML solution 100 mL (100 mLs Intravenous Contrast Given 02/05/22 2326)  morphine (PF) 4 MG/ML injection 4 mg (4 mg Intravenous Given 02/06/22 0104)  ondansetron (ZOFRAN) injection 4 mg (4 mg Intravenous Given 02/06/22 0104)   lactated ringers bolus 1,000 mL (1,000 mLs Intravenous New Bag/Given 02/06/22 0103)    ED Course/ Medical Decision Making/ A&P Clinical Course as of 02/06/22 0401  Wed Feb 06, 2022  0130 Consult called from urology resident, Dr. Kathrynn Ducking, who will present to the ED to see the patient at the bedside and establish treatment plan. I appreciate her collaboration in the care of this patient.  [RS]  0213 Pain improved from 10/10 to 3/10 after IV analgesia. NPO since 1 pm on 02/05/22. [RS]  [phone] Per urology resident, patient to OR at 7 am, NPO until that time. I appreciate her collaboration in the care of this patient.  [RS]    Clinical Course User Index [RS] Divine Hansley, Gypsy Balsam, PA-C                           Medical Decision Making 56 year old male presents with concern for left lower abdominal pain that started today.  Hypertensive on intake, vitals otherwise normal.  Cardiopulmonary abdominal exams are as above with left lower and upper quadrant tenderness to palpation as well as suprapubic tenderness without CVA tenderness.  No rebound or guarding.  Neurovascular intact in all extremities.  Differential diagnosis for Left lower quadrant pain includes is not limited to mesenteric ischemia, diverticulitis, nephrolithiasis/ureterolithiasis, UTI, pyelonephritis, psoas abscess, musculoskeletal strain, constipation, bowel obstruction.  Amount and/or Complexity of Data Reviewed Labs:     Details: CBC without anemia but with mild leukocytosis of 14,000, CMP with mild elevated BUN to 29 otherwise unremarkable.  Lipase is normal.  UA without infection.  Radiology:     Details:  CT abdomen pelvis with obstructing 9 x 10 x 11 mm calculus at the left UPJ with moderate hydronephrosis and bilateral nonobstructing nephrolithiasis.  Discussion of management or test interpretation with external provider(s): Consult to urology pending at this time.  Risk Prescription drug management. Decision regarding  hospitalization.   Patient with obstructive uropathy, will go to the OR at 7 AM for urologic intervention.  Resting comfortably after IV analgesia at this time.  Gavin  voiced understanding of his medical evaluation and treatment plan. Each of their questions answered to their expressed satisfaction. He is amenable to plan for intervention at this time.  This chart was dictated using voice recognition software, Dragon. Despite the best efforts of this provider to proofread and correct errors, errors may still occur which can change documentation meaning.   Final Clinical Impression(s) / ED Diagnoses Final diagnoses:  Obstructive uropathy    Rx / DC Orders ED Discharge Orders     None  Aura Dials 02/06/22 0401    Palumbo, April, MD 02/06/22 [phone]

## 2022-02-07 ENCOUNTER — Encounter (HOSPITAL_COMMUNITY): Payer: Self-pay | Admitting: Urology

## 2022-02-07 LAB — GENTAMICIN LEVEL, RANDOM: Gentamicin Rm: 1.3 ug/mL

## 2022-02-07 MED ORDER — OXYBUTYNIN CHLORIDE 5 MG PO TABS
5.0000 mg | ORAL_TABLET | Freq: Three times a day (TID) | ORAL | Status: DC | PRN
  Administered 2022-02-07: 5 mg via ORAL
  Filled 2022-02-07: qty 1

## 2022-02-07 MED ORDER — TAMSULOSIN HCL 0.4 MG PO CAPS: 0.4000 mg | ORAL_CAPSULE | Freq: Every day | ORAL | 1 refills | Status: DC | PRN

## 2022-02-07 MED ORDER — OXYCODONE-ACETAMINOPHEN 5-325 MG PO TABS: 1.0000 | ORAL_TABLET | Freq: Four times a day (QID) | ORAL | 0 refills | Status: DC | PRN

## 2022-02-07 MED ORDER — KETOROLAC TROMETHAMINE 10 MG PO TABS: 10.0000 mg | ORAL_TABLET | Freq: Three times a day (TID) | ORAL | 0 refills | Status: DC | PRN

## 2022-02-07 NOTE — Progress Notes (Signed)
1 Day Post-Op Subjective: No acute events overnight. Patient reporting intermittent bladder pressure and spasms. Also reporting hematuria, urine is thin without clot. Systolic pressures overnight soft in the 90s, patient asymptomatic. Tolerating regular diet.  Objective: Vital signs in last 24 hours: Temp:  [97.7 F (36.5 C)-99 F (37.2 C)] 97.7 F (36.5 C) (09/07 0353) Pulse Rate:  [61-87] 66 (09/07 0428) Resp:  [10-18] 17 (09/07 0353) BP: (95-119)/(41-79) 109/55 (09/07 0428) SpO2:  [93 %-100 %] 97 % (09/07 0353)  Intake/Output from previous day: 09/06 0701 - 09/07 0700 In: 1638.9 [P.O.:240; I.V.:1286.9; IV Piggyback:112] Out: -  Intake/Output this shift: No intake/output data recorded.  Physical Exam:  General: Alert and oriented CV: Regular rate Lungs: Normal work of breathing on room air Abdomen: Soft, non-distended, non-tender Ext: NT, No erythema  Lab Results: Recent Labs    02/05/22 1732  HGB 15.2  HCT 47.5   BMET Recent Labs    02/05/22 1732  NA 140  K 3.8  CL 106  CO2 25  GLUCOSE 109*  BUN 29*  CREATININE 1.05  CALCIUM 9.0     Studies/Results: DG C-Arm 1-60 Min-No Report  Result Date: 02/06/2022 Fluoroscopy was utilized by the requesting physician.  No radiographic interpretation.   CT ABDOMEN PELVIS W CONTRAST  Result Date: 02/06/2022 CLINICAL DATA:  Left lower quadrant abdominal pain EXAM: CT ABDOMEN AND PELVIS WITH CONTRAST TECHNIQUE: Multidetector CT imaging of the abdomen and pelvis was performed using the standard protocol following bolus administration of intravenous contrast. RADIATION DOSE REDUCTION: This exam was performed according to the departmental dose-optimization program which includes automated exposure control, adjustment of the mA and/or kV according to patient size and/or use of iterative reconstruction technique. CONTRAST:  166m OMNIPAQUE IOHEXOL 300 MG/ML  SOLN COMPARISON:  01/31/2020 FINDINGS: Lower chest: No acute  abnormality. Hepatobiliary: Moderate hepatic steatosis. Stable cyst within the left hepatic lobe. No enhancing intrahepatic mass. No intra or extrahepatic biliary ductal dilation. Status post cholecystectomy. Pancreas: Unremarkable Spleen: Unremarkable Adrenals/Urinary Tract: The adrenal glands are unremarkable. The kidneys are normal in size and position. Moderate left hydronephrosis has developed secondary to an obstructing 9 x 10 x 13 mm calculus within the left ureteropelvic junction. There is superimposed bilateral nonobstructing nephrolithiasis with multiple calculi measuring up to 6 mm within the kidneys bilaterally. No hydronephrosis on the right. No additional ureteral calculi. The bladder is unremarkable. Stomach/Bowel: Status post sigmoid colectomy. Stomach, small bowel, and large bowel are otherwise unremarkable. Appendix normal. No free intraperitoneal gas or fluid. Vascular/Lymphatic: Aortic atherosclerosis. No enlarged abdominal or pelvic lymph nodes. Reproductive: Prostate is unremarkable. Other: Tiny fat containing bilateral inguinal hernia Musculoskeletal: Degenerative changes are seen within the visualized thoracolumbar spine. Lytic lesion compatible with a probable vertebral hemangioma noted within the L1 vertebral body. No acute bone abnormality. No suspicious lytic or blastic bone lesion. IMPRESSION: 1. Obstructing 9 x 10 x 13 mm calculus within the left ureteropelvic junction resulting in moderate left hydronephrosis. Superimposed bilateral nonobstructing nephrolithiasis. 2. Moderate hepatic steatosis. 3. Status post sigmoid colectomy. Aortic Atherosclerosis (ICD10-I70.0). Electronically Signed   By: AFidela SalisburyM.D.   On: 02/06/2022 00:10    Assessment/Plan: 56year old male with hypertension, GERD, diverticulitis who presented with left 1.3cm obstructing UPJ stone and bilateral non-obstructing stones. Now s/p cystourethroscopy, bilateral ureteral stent placement on 02/06/2022. Admitted  for observation postoperatively.  - PRN Tylenol, oxycodone for pain control - Hold home anti-hypertensives in setting of soft pressures. - Gentle IVF, Regular diet. - IV Gentamicin  peri-operatively. Will discharge on empiric antibiotics on day of discharge. - Voiding spontaneously, - PRN Oxybutynin for bladder spasms. - Patient will follow up with [place] outpatient for elective bilateral ureteroscopic stone extraction with laser lithotripsy.   LOS: 0 days   Venita Seng Vincen Bejar 02/07/2022, 7:27 AM

## 2022-02-07 NOTE — Progress Notes (Signed)
Pharmacy Antibiotic Note  Brodyn Depuy is a 56 y.o. male who presented to the ED on 02/05/2022 with c/o abdominal pain. Abdominal CT on 02/05/22 showed obstructing stone in the left ureteropelvic junction resulting in moderate left hydronephrosis.  He underwent cystoscopy with ureteral stent placement on 02/06/22.  Pharmacy has been consulted to dose gentamicin for UTI. 02/07/2022 Gentamicin level drawn 11 hours after gent 470 mg = 1.3 mcg/ml. Level is within 24 hour range on [place] nomogram   Plan:  Continue gentamicin 470 mg IV dailty (~5 mg/kg/day using ABW) F/u for transition to oral antibiotics  ___________________________________  Height: '5\' 11"'$  (180.3 cm) Weight: 120.2 kg (265 lb) IBW/kg (Calculated) : 75.3  Temp (24hrs), Avg:98.2 F (36.8 C), Min:97.7 F (36.5 C), Max:99 F (37.2 C)  Recent Labs  Lab 02/05/22 1732 02/07/22 0439  WBC 14.1*  --   CREATININE 1.05  --   GENTRANDOM  --  1.3     Estimated Creatinine Clearance: 101.2 mL/min (by C-G formula based on SCr of 1.05 mg/dL).    Allergies  Allergen Reactions   Sulfamethoxazole Rash    Sulfa based antibiotics      Thank you for allowing pharmacy to be a part of this patient's care.  Eudelia Bunch, Pharm.D 02/07/2022 10:36 AM

## 2022-02-07 NOTE — Discharge Summary (Signed)
[facility] Discharge Summary  Admit date: 02/05/2022  Discharge date and time: 02/07/22   Discharge to: Home  Discharge Service: Urology  Discharge Attending Physician:  Alexis Frock, MD  Discharge  Diagnoses: Bilateral nephrolithiasis  Secondary Diagnosis: Principal Problem:   Bilateral nephrolithiasis   OR Procedures: Procedure(s): CYSTOSCOPY WITH RETROGRADE PYELOGRAM/URETERAL STENT PLACEMENT 02/06/2022   Ancillary Procedures: None   Discharge Day Services: The patient was seen and examined by the Urology team both in the morning and immediately prior to discharge.  Vital signs and laboratory values were stable and within normal limits.  The physical exam was benign and unchanged and all surgical wounds were examined.  Discharge instructions were explained and all questions answered.  Subjective  No acute events overnight. Pain Controlled. No fever or chills.  Objective Patient Vitals for the past 8 hrs:  BP Temp Temp src Pulse Resp SpO2  02/07/22 0428 (!) 109/55 -- -- 66 -- --  02/07/22 0353 (!) 95/41 97.7 F (36.5 C) Oral 70 17 97 %   No intake/output data recorded.  General Appearance:        No acute distress Lungs:                       Normal work of breathing on room air Heart:                                Regular rate and rhythm Abdomen:                         Soft, non-tender, non-distended Extremities:                      Warm and well perfused   Hospital Course:  56 year old male with a history of hypertension, GERD, diverticulitis presenting with left-sided flank pain and found to have 1.3 cm left obstructing UPJ stone and bilateral nonobstructing stones.  The patient underwent Cystourethroscopy, Bilateral Ureteral Stent Placement on 02/06/2022.  The patient tolerated the procedure well, was extubated in the OR, and afterwards was taken to the PACU for routine post-surgical care. When stable the patient was transferred to the floor.   The patient  did well postoperatively.  The patient's diet was slowly advanced and at the time of discharge was tolerating a regular diet.  The patient was discharged home 1 Day Post-Op, at which point was tolerating a regular solid diet, was able to void spontaneously, have adequate pain control with P.O. pain medication, and could ambulate without difficulty. The patient will follow up with [place] for elective bilateral ureteroscopic stone extraction.   Condition at Discharge: Improved  Discharge Medications:

## 2022-02-07 NOTE — Progress Notes (Signed)
  Transition of Care [facility]) Screening Note   Patient Details  Name: Andrew Hooper Date of Birth: 1966-01-01   Transition of [facility]) CM/SW Contact:    Roseanne Kaufman, RN Phone Number: 02/07/2022, 1:59 PM    Transition of Care Department [facility]) has reviewed patient and no TOC needs have been identified at this time. We will continue to monitor patient advancement through interdisciplinary progression rounds. If new patient transition needs arise, please place a TOC consult.

## 2022-02-07 NOTE — Discharge Instructions (Signed)
1 - You may have urinary urgency (bladder spasms) and bloody urine on / off with stent in place. This is normal. ° °2 - Call MD or go to ER for fever >102, severe pain / nausea / vomiting not relieved by medications, or acute change in medical status ° °

## 2022-02-08 NOTE — Progress Notes (Signed)
Subjective:  Patient ID: Andrew Hooper, male    DOB: [birth year],  MRN: [id number]  Chief Complaint  Patient presents with   Neuroma    56 y.o. male presents with the above complaint.  Patient presents with complaint of right submetatarsal 3 capsulitis.  Patient states pain for touch is progressive gotten worse he thinks it may be the neuroma that could be coming back.  Patient states that usually he could also be stopped neuroma.  He denies any other acute complaints he started noticing progressively.  He would like to discuss treatment options.   Review of Systems: Negative except as noted in the HPI. Denies N/V/F/Ch.  Past Medical History:  Diagnosis Date   Complication of anesthesia    rash afterwords"   Diverticulitis    GERD (gastroesophageal reflux disease)    Hypertension    Reflux     Current Outpatient Medications:    Cholecalciferol 50 MCG (2000 UT) CAPS, Take 2,000 Units by mouth daily., Disp: , Rfl:    escitalopram (LEXAPRO) 10 MG tablet, Take 10 mg by mouth daily., Disp: , Rfl:    irbesartan (AVAPRO) 300 MG tablet, Take 1 tablet (300 mg total) by mouth daily. Call and schedule follow up appointment for further refills. [phone]. 1st attempt, Disp: 30 tablet, Rfl: 0   ketorolac (TORADOL) 10 MG tablet, Take 1 tablet (10 mg total) by mouth every 8 (eight) hours as needed for moderate pain (or stent discomfort post-operatively)., Disp: 20 tablet, Rfl: 0   oxyCODONE-acetaminophen (PERCOCET) 5-325 MG tablet, Take 1 tablet by mouth every 6 (six) hours as needed for severe pain (post-operatively)., Disp: 15 tablet, Rfl: 0   rosuvastatin (CRESTOR) 10 MG tablet, TAKE 1 TABLET(10 MG) BY MOUTH DAILY (Patient taking differently: Take 10 mg by mouth daily.), Disp: 30 tablet, Rfl: 11   tamsulosin (FLOMAX) 0.4 MG CAPS capsule, Take 1 capsule (0.4 mg total) by mouth daily as needed. For stent discomfort, Disp: 30 capsule, Rfl: 1   zolpidem (AMBIEN) 10 MG tablet, Take 10 mg by mouth  at bedtime as needed for sleep., Disp: , Rfl:   Social History   Tobacco Use  Smoking Status Never  Smokeless Tobacco Never    Allergies  Allergen Reactions   Sulfamethoxazole Rash    Sulfa based antibiotics    Objective:  There were no vitals filed for this visit. There is no height or weight on file to calculate BMI. Constitutional Well developed. Well nourished.  Vascular Dorsalis pedis pulses palpable bilaterally. Posterior tibial pulses palpable bilaterally. Capillary refill normal to all digits.  No cyanosis or clubbing noted. Pedal hair growth normal.  Neurologic Normal speech. Oriented to person, place, and time. Epicritic sensation to light touch grossly present bilaterally.  Dermatologic Nails well groomed and normal in appearance. No open wounds. No skin lesions.  Orthopedic: Pain on palpation right third metatarsophalangeal joint pain with range of motion of the joint.  No signs of stump neuroma clinically appreciated.  Negative Mulder's click noted in the second interspace.   Radiographs: None Assessment:   1. Capsulitis of metatarsophalangeal (MTP) joint of right foot   2. Neuroma    Plan:  Patient was evaluated and treated and all questions answered.  Right third MTP capsulitis versus stump neuroma -All questions and concerns were discussed with the patient in extensive detail given the amount of pain that he is experiencing benefit from a steroid injection at decreasing inflammatory component associate with pain.  Patient agrees with plan like to proceed  with steroid injection -A steroid injection was performed at right third MTP joint using 1% plain Lidocaine and 10 mg of Kenalog. This was well tolerated. -I discussed shoe gear modification extensive detail as well   No follow-ups on file.

## 2022-02-11 ENCOUNTER — Other Ambulatory Visit: Payer: Self-pay | Admitting: Urology

## 2022-02-13 NOTE — Patient Instructions (Addendum)
SURGICAL WAITING ROOM VISITATION Patients having surgery or a procedure may have no more than 2 support people in the waiting area - these visitors may rotate.   Children under the age of 61 must have an adult with them who is not the patient. If the patient needs to stay at the hospital during part of their recovery, the visitor guidelines for inpatient rooms apply. Pre-op nurse will coordinate an appropriate time for 1 support person to accompany patient in pre-op.  This support person may not rotate.    Please refer to the [facility] website for the visitor guidelines for Inpatients (after your surgery is over and you are in a regular room).      Your procedure is scheduled on: 02-20-22   Report to [facility] Main Entrance    Report to admitting at 7:15 AM   Call this number if you have problems the morning of surgery [phone]   Do not eat food :After Midnight.   After Midnight you may have the following liquids until 6:30 AM DAY OF SURGERY  Water Non-Citrus Juices (without pulp, NO RED) Carbonated Beverages Black Coffee (NO MILK/CREAM OR CREAMERS, sugar ok)  Clear Tea (NO MILK/CREAM OR CREAMERS, sugar ok) regular and decaf                             Plain Jell-O (NO RED)                                           Fruit ices (not with fruit pulp, NO RED)                                     Popsicles (NO RED)                                                               Sports drinks like Gatorade (NO RED)                       If you have questions, please contact your surgeon's office.   FOLLOW ANY ADDITIONAL PRE OP INSTRUCTIONS YOU RECEIVED FROM YOUR SURGEON'S OFFICE!!!     Oral Hygiene is also important to reduce your risk of infection.                                    Remember - BRUSH YOUR TEETH THE MORNING OF SURGERY WITH YOUR REGULAR TOOTHPASTE   Do NOT smoke after Midnight   Take these medicines the morning of surgery with A SIP OF WATER:  Escitalopram, Rosuvastatin, Tamsulosin. Tylenol if needed  Bring CPAP mask and tubing day of surgery.                              You may not have any metal on your body including  jewelry, and body piercing  Do not wear  lotions, powders, cologne, or deodorant              Men may shave face and neck.   Do not bring valuables to the hospital. [place].   Contacts, dentures or bridgework may not be worn into surgery.  DO NOT [place]. PHARMACY WILL DISPENSE MEDICATIONS LISTED ON YOUR MEDICATION LIST TO YOU DURING YOUR ADMISSION [place]!   Patients discharged on the day of surgery will not be allowed to drive home.  Someone NEEDS to stay with you for the first 24 hours after anesthesia.  Please read over the following fact sheets you were given: IF YOU HAVE QUESTIONS ABOUT YOUR PRE-OP INSTRUCTIONS PLEASE CALL [facility] - Preparing for Surgery Before surgery, you can play an important role.  Because skin is not sterile, your skin needs to be as free of germs as possible.  You can reduce the number of germs on your skin by washing with CHG (chlorahexidine gluconate) soap before surgery.  CHG is an antiseptic cleaner which kills germs and bonds with the skin to continue killing germs even after washing. Please DO NOT use if you have an allergy to CHG or antibacterial soaps.  If your skin becomes reddened/irritated stop using the CHG and inform your nurse when you arrive at Short Stay. Do not shave (including legs and underarms) for at least 48 hours prior to the first CHG shower.  You may shave your face/neck.  Please follow these instructions carefully:  1.  Shower with CHG Soap the night before surgery and the  morning of surgery.  2.  If you choose to wash your hair, wash your hair first as usual with your normal  shampoo.  3.  After you shampoo, rinse your hair and body  thoroughly to remove the shampoo.                             4.  Use CHG as you would any other liquid soap.  You can apply chg directly to the skin and wash.  Gently with a scrungie or clean washcloth.  5.  Apply the CHG Soap to your body ONLY FROM THE NECK DOWN.   Do   not use on face/ open                           Wound or open sores. Avoid contact with eyes, ears mouth and   genitals (private parts).                       Wash face,  Genitals (private parts) with your normal soap.             6.  Wash thoroughly, paying special attention to the area where your    surgery  will be performed.  7.  Thoroughly rinse your body with warm water from the neck down.  8.  DO NOT shower/wash with your normal soap after using and rinsing off the CHG Soap.                9.  Pat yourself dry with a clean towel.            10.  Wear clean pajamas.  11.  Place clean sheets on your bed the night of your first shower and do not  sleep with pets. Day of Surgery : Do not apply any lotions/deodorants the morning of surgery.  Please wear clean clothes to the hospital/surgery center.  FAILURE TO FOLLOW THESE INSTRUCTIONS MAY RESULT IN THE CANCELLATION OF YOUR SURGERY  PATIENT SIGNATURE_________________________________  NURSE SIGNATURE__________________________________  ________________________________________________________________________

## 2022-02-13 NOTE — Progress Notes (Addendum)
COVID Vaccine Completed:  Yes  Date of COVID positive in last 90 days:  No  PCP - Deland Pretty, MD Cardiologist - Larae Grooms, MD Pulmonologist - Sherrilyn Rist, MD  Chest x-ray - 09-09-21 Epic EKG - 02-08-22 Epic Stress Test - greater than 2 years ECHO - greater than 2 years Epic Cardiac Cath - N/A Pacemaker/ICD device last checked: Spinal Cord Stimulator:N/A Coronary CT - 2021 Epic  Bowel Prep - N/A  Sleep Study - Yes, +sleep apnea CPAP - Yes  Fasting Blood Sugar - N/A Checks Blood Sugar _____ times a day  Blood Thinner Instructions:  N/A Aspirin Instructions: Last Dose:  Activity level:  Can go up a flight of stairs and perform activities of daily living without stopping and without symptoms of chest pain or shortness of breath.  Able to exercise without symptoms  Anesthesia review:  Abnormal R wave progression on EKG, dilated aorta.  HTN, OSA  Patient denies shortness of breath, fever, cough and chest pain at PAT appointment  Patient verbalized understanding of instructions that were given to them at the PAT appointment. Patient was also instructed that they will need to review over the PAT instructions again at home before surgery.

## 2022-02-13 NOTE — Progress Notes (Signed)
Left voicemail for Andrew Hooper requesting pre op orders in epic.

## 2022-02-15 ENCOUNTER — Other Ambulatory Visit: Payer: Self-pay

## 2022-02-15 ENCOUNTER — Encounter (HOSPITAL_COMMUNITY)
Admission: RE | Admit: 2022-02-15 | Discharge: 2022-02-15 | Disposition: A | Discharge status: Home / Self Care | Payer: 59 | Source: Ambulatory Visit | Attending: Urology | Admitting: Urology

## 2022-02-15 ENCOUNTER — Encounter (HOSPITAL_COMMUNITY): Payer: Self-pay

## 2022-02-15 VITALS — BP 124/77 | HR 66 | Temp 98.2°F | Resp 16 | Ht 71.0 in | Wt 262.0 lb

## 2022-02-15 DIAGNOSIS — Z01812 Encounter for preprocedural laboratory examination: Secondary | ICD-10-CM | POA: Insufficient documentation

## 2022-02-15 DIAGNOSIS — I2510 Atherosclerotic heart disease of native coronary artery without angina pectoris: Secondary | ICD-10-CM | POA: Insufficient documentation

## 2022-02-15 HISTORY — DX: Personal history of urinary calculi: Z87.442

## 2022-02-15 HISTORY — DX: Anxiety disorder, unspecified: F41.9

## 2022-02-15 HISTORY — DX: Unspecified osteoarthritis, unspecified site: M19.90

## 2022-02-15 HISTORY — DX: Sleep apnea, unspecified: G47.30

## 2022-02-15 LAB — BASIC METABOLIC PANEL
Anion gap: 4 — ABNORMAL LOW (ref 5–15)
BUN: 34 mg/dL — ABNORMAL HIGH (ref 6–20)
CO2: 27 mmol/L (ref 22–32)
Calcium: 8.9 mg/dL (ref 8.9–10.3)
Chloride: 107 mmol/L (ref 98–111)
Creatinine, Ser: 0.89 mg/dL (ref 0.61–1.24)
GFR, Estimated: >60 mL/min (ref >60)
Glucose, Bld: 103 mg/dL — ABNORMAL HIGH (ref 70–99)
Potassium: 4.4 mmol/L (ref 3.5–5.1)
Sodium: 138 mmol/L (ref 135–145)

## 2022-02-20 ENCOUNTER — Encounter (HOSPITAL_COMMUNITY)
Admission: RE | Disposition: A | Discharge status: Home / Self Care | Payer: Self-pay | Source: Ambulatory Visit | Attending: Urology

## 2022-02-20 ENCOUNTER — Encounter (HOSPITAL_COMMUNITY): Payer: Self-pay | Admitting: Urology

## 2022-02-20 ENCOUNTER — Ambulatory Visit (HOSPITAL_COMMUNITY): Payer: 59 | Admitting: Physician Assistant

## 2022-02-20 ENCOUNTER — Ambulatory Visit (HOSPITAL_COMMUNITY): Payer: 59

## 2022-02-20 ENCOUNTER — Ambulatory Visit (HOSPITAL_COMMUNITY)
Admission: RE | Admit: 2022-02-20 | Discharge: 2022-02-20 | Disposition: A | Discharge status: Home / Self Care | Payer: 59 | Source: Ambulatory Visit | Attending: Urology | Admitting: Urology

## 2022-02-20 ENCOUNTER — Ambulatory Visit (HOSPITAL_BASED_OUTPATIENT_CLINIC_OR_DEPARTMENT_OTHER): Payer: 59 | Admitting: Anesthesiology

## 2022-02-20 DIAGNOSIS — G4730 Sleep apnea, unspecified: Secondary | ICD-10-CM | POA: Diagnosis not present

## 2022-02-20 DIAGNOSIS — I10 Essential (primary) hypertension: Secondary | ICD-10-CM | POA: Diagnosis not present

## 2022-02-20 DIAGNOSIS — F419 Anxiety disorder, unspecified: Secondary | ICD-10-CM | POA: Diagnosis not present

## 2022-02-20 DIAGNOSIS — M1990 Unspecified osteoarthritis, unspecified site: Secondary | ICD-10-CM | POA: Insufficient documentation

## 2022-02-20 DIAGNOSIS — N132 Hydronephrosis with renal and ureteral calculous obstruction: Secondary | ICD-10-CM | POA: Insufficient documentation

## 2022-02-20 DIAGNOSIS — N200 Calculus of kidney: Secondary | ICD-10-CM | POA: Diagnosis not present

## 2022-02-20 DIAGNOSIS — E669 Obesity, unspecified: Secondary | ICD-10-CM | POA: Diagnosis not present

## 2022-02-20 HISTORY — PX: CYSTOSCOPY WITH RETROGRADE PYELOGRAM, URETEROSCOPY AND STENT PLACEMENT: SHX5789

## 2022-02-20 HISTORY — PX: HOLMIUM LASER APPLICATION: SHX5852

## 2022-02-20 SURGERY — CYSTOURETEROSCOPY, WITH RETROGRADE PYELOGRAM AND STENT INSERTION
Anesthesia: General | Laterality: Bilateral

## 2022-02-20 MED ORDER — SODIUM CHLORIDE 0.9 % IR SOLN
Status: DC | PRN
  Administered 2022-02-20: 3000 mL

## 2022-02-20 MED ORDER — PROPOFOL 10 MG/ML IV BOLUS
INTRAVENOUS | Status: AC
  Filled 2022-02-20: qty 20

## 2022-02-20 MED ORDER — GENTAMICIN SULFATE 40 MG/ML IJ SOLN
5.0000 mg/kg | INTRAVENOUS | Status: AC
  Administered 2022-02-20: 460 mg via INTRAVENOUS
  Filled 2022-02-20: qty 11.5

## 2022-02-20 MED ORDER — PROMETHAZINE HCL 25 MG/ML IJ SOLN: 6.2500 mg | INTRAMUSCULAR | Status: DC | PRN

## 2022-02-20 MED ORDER — ONDANSETRON HCL 4 MG/2ML IJ SOLN
INTRAMUSCULAR | Status: DC | PRN
  Administered 2022-02-20: 4 mg via INTRAVENOUS

## 2022-02-20 MED ORDER — FENTANYL CITRATE (PF) 100 MCG/2ML IJ SOLN
INTRAMUSCULAR | Status: AC
  Filled 2022-02-20: qty 2

## 2022-02-20 MED ORDER — ORAL CARE MOUTH RINSE: 15.0000 mL | Freq: Once | OROMUCOSAL | Status: AC

## 2022-02-20 MED ORDER — MIDAZOLAM HCL 2 MG/2ML IJ SOLN
INTRAMUSCULAR | Status: AC
  Filled 2022-02-20: qty 2

## 2022-02-20 MED ORDER — FENTANYL CITRATE PF 50 MCG/ML IJ SOSY: 25.0000 ug | PREFILLED_SYRINGE | INTRAMUSCULAR | Status: DC | PRN

## 2022-02-20 MED ORDER — KETOROLAC TROMETHAMINE 30 MG/ML IJ SOLN
INTRAMUSCULAR | Status: AC
  Filled 2022-02-20: qty 1

## 2022-02-20 MED ORDER — KETOROLAC TROMETHAMINE 30 MG/ML IJ SOLN
INTRAMUSCULAR | Status: DC | PRN
  Administered 2022-02-20: 30 mg via INTRAVENOUS

## 2022-02-20 MED ORDER — MIDAZOLAM HCL 2 MG/2ML IJ SOLN
INTRAMUSCULAR | Status: DC | PRN
  Administered 2022-02-20: 2 mg via INTRAVENOUS

## 2022-02-20 MED ORDER — IOHEXOL 300 MG/ML  SOLN
INTRAMUSCULAR | Status: DC | PRN
  Administered 2022-02-20: 50 mL

## 2022-02-20 MED ORDER — DEXAMETHASONE SODIUM PHOSPHATE 4 MG/ML IJ SOLN
INTRAMUSCULAR | Status: DC | PRN
  Administered 2022-02-20: 5 mg via INTRAVENOUS

## 2022-02-20 MED ORDER — OXYCODONE-ACETAMINOPHEN 5-325 MG PO TABS: 1.0000 | ORAL_TABLET | Freq: Four times a day (QID) | ORAL | 0 refills | Status: DC | PRN

## 2022-02-20 MED ORDER — ACETAMINOPHEN 500 MG PO TABS
1000.0000 mg | ORAL_TABLET | Freq: Once | ORAL | Status: AC
  Administered 2022-02-20: 1000 mg via ORAL
  Filled 2022-02-20: qty 2

## 2022-02-20 MED ORDER — DEXAMETHASONE SODIUM PHOSPHATE 10 MG/ML IJ SOLN
INTRAMUSCULAR | Status: AC
  Filled 2022-02-20: qty 1

## 2022-02-20 MED ORDER — LIDOCAINE 2% (20 MG/ML) 5 ML SYRINGE
INTRAMUSCULAR | Status: DC | PRN
  Administered 2022-02-20: 100 mg via INTRAVENOUS

## 2022-02-20 MED ORDER — LACTATED RINGERS IV SOLN: INTRAVENOUS | Status: DC

## 2022-02-20 MED ORDER — CHLORHEXIDINE GLUCONATE 0.12 % MT SOLN
15.0000 mL | Freq: Once | OROMUCOSAL | Status: AC
  Administered 2022-02-20: 15 mL via OROMUCOSAL

## 2022-02-20 MED ORDER — PHENYLEPHRINE 80 MCG/ML (10ML) SYRINGE FOR IV PUSH (FOR BLOOD PRESSURE SUPPORT)
PREFILLED_SYRINGE | INTRAVENOUS | Status: AC
  Filled 2022-02-20: qty 10

## 2022-02-20 MED ORDER — FENTANYL CITRATE (PF) 100 MCG/2ML IJ SOLN
INTRAMUSCULAR | Status: DC | PRN
  Administered 2022-02-20 (×4): 50 ug via INTRAVENOUS

## 2022-02-20 MED ORDER — PHENYLEPHRINE 80 MCG/ML (10ML) SYRINGE FOR IV PUSH (FOR BLOOD PRESSURE SUPPORT)
PREFILLED_SYRINGE | INTRAVENOUS | Status: DC | PRN
  Administered 2022-02-20 (×5): 120 ug via INTRAVENOUS

## 2022-02-20 MED ORDER — PROPOFOL 10 MG/ML IV BOLUS
INTRAVENOUS | Status: DC | PRN
  Administered 2022-02-20: 200 mg via INTRAVENOUS

## 2022-02-20 MED ORDER — KETOROLAC TROMETHAMINE 10 MG PO TABS: 10.0000 mg | ORAL_TABLET | Freq: Three times a day (TID) | ORAL | 0 refills | Status: DC | PRN

## 2022-02-20 MED ORDER — ONDANSETRON HCL 4 MG/2ML IJ SOLN
INTRAMUSCULAR | Status: AC
  Filled 2022-02-20: qty 2

## 2022-02-20 MED ORDER — CEPHALEXIN 500 MG PO CAPS: 500.0000 mg | ORAL_CAPSULE | Freq: Two times a day (BID) | ORAL | 0 refills | Status: AC

## 2022-02-20 SURGICAL SUPPLY — 24 items
BAG URO CATCHER STRL LF (MISCELLANEOUS) ×1 IMPLANT
BASKET LASER NITINOL 1.9FR (BASKET) IMPLANT
CATH URETL OPEN END 6FR 70 (CATHETERS) ×1 IMPLANT
CLOTH BEACON ORANGE TIMEOUT ST (SAFETY) ×1 IMPLANT
EXTRACTOR STONE 1.7FRX115CM (UROLOGICAL SUPPLIES) IMPLANT
GLOVE SURG LX STRL 7.5 STRW (GLOVE) ×1 IMPLANT
GOWN SRG XL LVL 4 BRTHBL STRL (GOWNS) ×1 IMPLANT
GOWN STRL NON-REIN XL LVL4 (GOWNS) ×1
GOWN STRL REUS W/ TWL XL LVL3 (GOWN DISPOSABLE) ×1 IMPLANT
GOWN STRL REUS W/TWL XL LVL3 (GOWN DISPOSABLE) ×1
GUIDEWIRE ANG ZIPWIRE 038X150 (WIRE) ×1 IMPLANT
GUIDEWIRE STR DUAL SENSOR (WIRE) ×1 IMPLANT
KIT TURNOVER KIT A (KITS) IMPLANT
LASER FIB FLEXIVA PULSE ID 365 (Laser) IMPLANT
MANIFOLD NEPTUNE II (INSTRUMENTS) ×1 IMPLANT
PACK CYSTO (CUSTOM PROCEDURE TRAY) ×1 IMPLANT
SHEATH NAVIGATOR HD 11/13X28 (SHEATH) IMPLANT
SHEATH NAVIGATOR HD 11/13X36 (SHEATH) IMPLANT
STENT POLARIS 5FRX26 (STENTS) IMPLANT
TRACTIP FLEXIVA PULS ID 200XHI (Laser) IMPLANT
TRACTIP FLEXIVA PULSE ID 200 (Laser) ×1
TUBE FEEDING 8FR 16IN STR KANG (MISCELLANEOUS) ×1 IMPLANT
TUBING CONNECTING 10 (TUBING) ×1 IMPLANT
TUBING UROLOGY SET (TUBING) ×1 IMPLANT

## 2022-02-20 NOTE — H&P (Signed)
Andrew Hooper is an 56 y.o. male.    Chief Complaint: Pre-OP BILATERAL Ureteroscopic Stone Manipulation  HPI:   1 - Left Ureteral / Bilateral Renal Stones - Lef 1.3cm UPJ with mod hydro and bilateral renal stones on ER CT on eval abd pain 02/06/22. UA without significant infectiosu parameters. Underwent bilateral stenting as temporizing measure on 9/6 in face of difficult to control colic and chills.    PMH sig for lap chole, segmental colon resection (diverticulitis). He is in [employer] for [employer], lived previously in [place]. His PCP is Arnold Long MD.   Today "Andrew Hooper" is seen to proceed with BILATERAL ureteroscopic stone manipulation. No interval fevers. Wife with C19 but he has zero pulm / resp symtpoms and feels at baseline.     Past Medical History:  Diagnosis Date   Anxiety    Arthritis    Complication of anesthesia    rash afterwords"   Diverticulitis    GERD (gastroesophageal reflux disease)    History of kidney stones    Hypertension    Reflux    Sleep apnea     Past Surgical History:  Procedure Laterality Date   BACK SURGERY     BACK SURGERY     CHOLECYSTECTOMY N/A 02/02/2020   Procedure: LAPAROSCOPIC CHOLECYSTECTOMY WITH INTRAOPERATIVE CHOLANGIOGRAM;  Surgeon: Kieth Brightly Arta Bruce, MD;  Location: [facility];  Service: General;  Laterality: N/A;   COLON SURGERY     COLON SURGERY     CYSTOSCOPY W/ URETERAL STENT PLACEMENT Bilateral 02/06/2022   Procedure: CYSTOSCOPY WITH RETROGRADE PYELOGRAM/URETERAL STENT PLACEMENT;  Surgeon: Alexis Frock, MD;  Location: [facility];  Service: Urology;  Laterality: Bilateral;   KNEE SURGERY     KNEE SURGERY     SHOULDER SURGERY      Family History  Problem Relation Age of Onset   Breast cancer Mother    Rectal cancer Mother    Deep vein thrombosis Mother    Stroke Father    Heart disease Father 56   Heart disease Brother 28   Social History:  reports that he has never smoked. He has never used smokeless tobacco. He reports  current alcohol use. He reports that he does not use drugs.  Allergies:  Allergies  Allergen Reactions   Sulfa Antibiotics Rash   Sulfamethoxazole Rash    Sulfa based antibiotics     Medications Prior to Admission  Medication Sig Dispense Refill   acetaminophen (TYLENOL) 500 MG tablet Take 1,000 mg by mouth every 6 (six) hours as needed for moderate pain.     Cholecalciferol 50 MCG (2000 UT) CAPS Take 2,000 Units by mouth daily.     escitalopram (LEXAPRO) 10 MG tablet Take 10 mg by mouth daily.     irbesartan (AVAPRO) 300 MG tablet Take 1 tablet (300 mg total) by mouth daily. Call and schedule follow up appointment for further refills. [phone]. 1st attempt 30 tablet 0   NON FORMULARY Pt uses a cpap nightly     rosuvastatin (CRESTOR) 10 MG tablet TAKE 1 TABLET(10 MG) BY MOUTH DAILY 30 tablet 11   tamsulosin (FLOMAX) 0.4 MG CAPS capsule Take 1 capsule (0.4 mg total) by mouth daily as needed. For stent discomfort 30 capsule 1   ketorolac (TORADOL) 10 MG tablet Take 1 tablet (10 mg total) by mouth every 8 (eight) hours as needed for moderate pain (or stent discomfort post-operatively). 20 tablet 0   oxyCODONE-acetaminophen (PERCOCET) 5-325 MG tablet Take 1 tablet by mouth every 6 (six)  hours as needed for severe pain (post-operatively). 15 tablet 0   zolpidem (AMBIEN CR) 12.5 MG CR tablet Take 12.5 mg by mouth at bedtime as needed for sleep.      No results found for this or any previous visit (from the past 48 hour(s)). No results found.  Review of Systems  Constitutional:  Negative for chills and fever.  All other systems reviewed and are negative.   Blood pressure 135/75, pulse 71, temperature 98.1 F (36.7 C), temperature source Oral, resp. rate 15, weight 118.8 kg, SpO2 95 %. Physical Exam Vitals reviewed.  HENT:     Head: Normocephalic.  Eyes:     Pupils: Pupils are equal, round, and reactive to light.  Pulmonary:     Effort: Pulmonary effort is normal.  Abdominal:      General: Abdomen is flat.  Genitourinary:    Comments: No CVAT at present Neurological:     Mental Status: He is alert.      Assessment/Plan  Proceed as planned with BILATERAL ureteroscopic stone manipulation. Risks, benefits, alternatives, expected peri-op course discussed previously and reiterated today.   Alexis Frock, MD 02/20/2022, 8:26 AM

## 2022-02-20 NOTE — Progress Notes (Signed)
Pt states his wife has tested + for covid , wife has had symptoms since 9/16. Pt denies any symptoms and says he doesn't have any symptoms ; pt states he called Dr. Zettie Pho office yesterday and they said that as long as he didn't have any symptoms or  test + surgery would still proceed. Dr Gifford Shave aware and no further orders at this time, ok to proceed with surgery.

## 2022-02-20 NOTE — Discharge Instructions (Addendum)
1 - You may have urinary urgency (bladder spasms) and bloody urine on / off with stent in place. This is normal.  2 - Remove tethered stents on Friday morning at home by pulling on strings, then blue-white plastic tubing, and discarding. Office is open Friday if any problems arise.   3 - Call MD or go to ER for fever >102, severe pain / nausea / vomiting not relieved by medications, or acute change in medical status

## 2022-02-20 NOTE — Op Note (Signed)
NAMERODRECUS, BELSKY MEDICAL RECORD NO: [id number] ACCOUNT NO: [id number] DATE OF BIRTH: 1965/06/22 FACILITY: [facility] LOCATION: [facility] PHYSICIAN: Alexis Frock, MD  Operative Report   DATE OF PROCEDURE: 02/20/2022  PREOPERATIVE DIAGNOSES:  Left ureteral, bilateral renal stones.  PROCEDURE PERFORMED:  1.  Cystoscopy with bilateral retrograde pyelograms interpretation. 2.  Bilateral ureteroscopy with laser lithotripsy. 3.  Exchange of bilateral ureteral stents.  ESTIMATED BLOOD LOSS:  Nil.  COMPLICATIONS:  None.  SPECIMENS:  Bilateral renal and ureteral stone fragments for composition analysis.  FINDINGS:  1.  Left proximal large ureteral stone with moderate hydronephrosis. 2.  Bilateral papillary tip renal stones. 3.  Complete resolution of all accessible stone fragments larger than one-third mm following laser lithotripsy and basket extraction. 4.  Successful replacement of bilateral ureteral stents, proximal end in the renal pelvis, distal end in urinary bladder, with tether.  INDICATIONS:  The patient is a pleasant 56 year old [employer] with the [place] who was found on workup of colicky flank pain to have a large left proximal ureteral stone as well as bilateral renal stones.  His colic was quite difficult to control.  He  did have some subjective questionable infectious parameters.  He underwent bilateral stenting earlier this month as a temporizing measure.  Unfortunately, he did not declare any significant infectious parameters.  He now presents today for definitive  stone management with ureteroscopy with goal of stone free.  Informed consent was obtained and placed in medical record.  PROCEDURE IN DETAIL:  The patient being verified, procedure being bilateral ureteroscopic stone manipulation was confirmed.  Procedure timeout was performed.  Intravenous antibiotics were administered and general LMA anesthesia was induced.  The patient  was placed into a low lithotomy  position.  Sterile field was created, prepped and draped the patient's penis, perineum, and proximal thighs using iodine.  Cystourethroscopy performed using 21-French rigid cystoscope with offset lens.  Inspection of  anterior and posterior urethra was unremarkable.  Inspection of urinary bladder revealed distal end of bilateral ureteral stents in situ. Distal end of the left stent was grasped, brought to the level of the urethral meatus.  A 0.038 ZIPwire was  advanced, exchanged for an open-ended catheter and left retrograde pyelogram was obtained.  Left retrograde pyelogram demonstrated single left ureter, single system left kidney.  Large filling defect consistent with known stone in  the proximal ureter with moderate hydronephrosis.  A ZIPwire was once again advanced, set aside as a safety wire.   Next, the distal end of the right stent was grasped, brought to the level of the urethral meatus, exchanged for open-ended catheter and right retrograde pyelogram was obtained.  Right retrograde pyelogram demonstrated single right ureter, single system right kidney.  No filling defects or narrowing noted.  A ZIPwire once again advanced on the right side, set aside as a separate safety wire.  An 8-French feeding tube placed in  the urinary bladder for pressure release.  Next, semirigid ureteroscopy performed of the distal four-fifths of the right ureter alongside a separate sensor working wire.  No mucosal abnormalities were found.  Next, semirigid ureteroscopy was performed of  the distal two-thirds of the left ureter alongside a separate sensor working wire.  No mucosal abnormalities were found.  Next, the medium length ureteral access sheath was placed over the right sensor working wire at the level of the proximal right  ureter using continuous fluoroscopic guidance and flexible digital ureteroscopy was performed of the proximal ureter and systematic inspection of the right  kidney, including all calices  x3.  There was multifocal papillary tip calcifications, mostly in  upper and upper mid pole.  These were too large for simple basketing.  Holmium laser energy then applied to stone using escalating settings to 0.3 joules and 30 Hz and using dusting technique, approximately 60% of the stone volume was dusted, 40%  fragmented, with the fragments then being amenable to simple basketing and all fragments larger than one-third mm were removed, set aside for composition analysis.  The access sheath was then removed under continuous vision, no significant mucosal  abnormalities were found on the right side.  Next, the access sheath was placed over the left sensor working wire to the level of the proximal left ureter using continuous fluoroscopic guidance, taking exquisite care not to pass proximal to the area of  known stone and flexible digital ureteroscopy was performed, proximal left ureter, as expected large stone was seen in the upper ureter.  This did appear to be moderately impacted.  It was much too large for simple basketing.  Holmium laser energy was  then applied to stone using setting of 0.3 joules and 30 Hz and approximately 60% of the stone dusted, 40% fragmented, with the fragments being amenable to simple basketing.  Inspection of the kidney including all calices x3 did reveal some additional  papillary tip calcifications, mostly in lower mid and lower pole that similarly were ablated using holmium laser energy with any residual fragments being amenable to simple basketing.  Following this, complete resolution of all accessible stone fragments  larger than one-third mm, hemostasis was excellent.  Sponge and needle counts were correct.  Given the bilateral nature of procedure, it was felt that interval stenting with tethered stents would be most prudent.  Access sheath was removed under  continuous vision on the left side.  No significant mucosal abnormalities were found and finally new 5 x 26  Polaris type stents were placed over remaining safety wires using fluoroscopic guidance bilaterally.  Tethers were fashioned together, trimmed to  length and taped to the dorsum of penis.  The procedure was terminated.  The patient tolerated procedure well, no immediate periprocedural complications.  The patient was taken to postanesthesia care in stable condition.  Plan for discharge home.   SHW D: 02/20/2022 11:13:42 am T: 02/20/2022 1:39:00 pm  JOB: [id number]/ [id number]

## 2022-02-20 NOTE — Anesthesia Preprocedure Evaluation (Addendum)
Anesthesia Evaluation  Patient identified by MRN, date of birth, ID band Patient awake    Reviewed: Allergy & Precautions, NPO status , Patient's Chart, lab work & pertinent test results  Airway Mallampati: III  TM Distance: >3 FB Neck ROM: Full    Dental  (+) Teeth Intact, Dental Advisory Given   Pulmonary sleep apnea and Continuous Positive Airway Pressure Ventilation ,    Pulmonary exam normal breath sounds clear to auscultation       Cardiovascular hypertension, Pt. on medications Normal cardiovascular exam Rhythm:Regular Rate:Normal     Neuro/Psych PSYCHIATRIC DISORDERS Anxiety negative neurological ROS     GI/Hepatic Neg liver ROS, GERD  ,  Endo/Other  Obesity   Renal/GU Renal disease (RENAL STONES)     Musculoskeletal  (+) Arthritis ,   Abdominal   Peds  Hematology negative hematology ROS (+)   Anesthesia Other Findings   Reproductive/Obstetrics                            Anesthesia Physical Anesthesia Plan  ASA: 2  Anesthesia Plan: General   Post-op Pain Management: Tylenol PO (pre-op)* and Toradol IV (intra-op)*   Induction: Intravenous  PONV Risk Score and Plan: 2 and Midazolam, Dexamethasone and Ondansetron  Airway Management Planned: LMA  Additional Equipment:   Intra-op Plan:   Post-operative Plan: Extubation in OR  Informed Consent: I have reviewed the patients History and Physical, chart, labs and discussed the procedure including the risks, benefits and alternatives for the proposed anesthesia with the patient or authorized representative who has indicated his/her understanding and acceptance.     Dental advisory given  Plan Discussed with: CRNA  Anesthesia Plan Comments:         Anesthesia Quick Evaluation

## 2022-02-20 NOTE — Anesthesia Procedure Notes (Signed)
Procedure Name: LMA Insertion Date/Time: 02/20/2022 9:44 AM  Performed by: Claudia Desanctis, CRNAPre-anesthesia Checklist: Emergency Drugs available, Patient identified, Suction available and Patient being monitored Patient Re-evaluated:Patient Re-evaluated prior to induction Oxygen Delivery Method: Circle system utilized Preoxygenation: Pre-oxygenation with 100% oxygen Induction Type: IV induction Ventilation: Mask ventilation without difficulty LMA: LMA inserted LMA Size: 4.0 Number of attempts: 1 Placement Confirmation: positive ETCO2 and breath sounds checked- equal and bilateral Tube secured with: Tape Dental Injury: Teeth and Oropharynx as per pre-operative assessment

## 2022-02-20 NOTE — Brief Op Note (Signed)
02/20/2022  11:07 AM  PATIENT:  Andrew Hooper  56 y.o. male  PRE-OPERATIVE DIAGNOSIS:  BILATERAL RENAL STONES  POST-OPERATIVE DIAGNOSIS:  BILATERAL RENAL STONES  PROCEDURE:  Procedure(s) with comments: CYSTOSCOPY WITH RETROGRADE PYELOGRAM, URETEROSCOPY AND STENT EXCHANGE (Bilateral) - 90 MINS HOLMIUM LASER APPLICATION (Bilateral)  SURGEON:  Surgeon(s) and Role:    Alexis Frock, MD - Primary  PHYSICIAN ASSISTANT:   ASSISTANTS: none   ANESTHESIA:   general  EBL:  0 mL   BLOOD ADMINISTERED:none  DRAINS: none   LOCAL MEDICATIONS USED:  NONE  SPECIMEN:  Source of Specimen:  bilateral renal / ureteral stone fragments   DISPOSITION OF SPECIMEN:   [facility] for compositional analysis  COUNTS:  YES  TOURNIQUET:  * No tourniquets in log *  DICTATION: .Other Dictation: Dictation Number [id number]  PLAN OF CARE: Discharge to home after PACU  PATIENT DISPOSITION:  PACU - hemodynamically stable.   Delay start of Pharmacological VTE agent (>24hrs) due to surgical blood loss or risk of bleeding: yes

## 2022-02-20 NOTE — Transfer of Care (Signed)
Immediate Anesthesia Transfer of Care Note  Patient: Andrew Hooper  Procedure(s) Performed: CYSTOSCOPY WITH RETROGRADE PYELOGRAM, URETEROSCOPY AND STENT EXCHANGE (Bilateral) HOLMIUM LASER APPLICATION (Bilateral)  Patient Location: PACU  Anesthesia Type:General  Level of Consciousness: awake and patient cooperative  Airway & Oxygen Therapy: Patient Spontanous Breathing and Patient connected to face mask  Post-op Assessment: Report given to RN and Post -op Vital signs reviewed and stable  Post vital signs: Reviewed and stable  Last Vitals:  Vitals Value Taken Time  BP 131/90 02/20/22 1117  Temp    Pulse 82 02/20/22 1120  Resp 12 02/20/22 1120  SpO2 98 % 02/20/22 1120  Vitals shown include unvalidated device data.  Last Pain:  Vitals:   02/20/22 0741  TempSrc:   PainSc: 3          Complications: No notable events documented.

## 2022-02-21 ENCOUNTER — Encounter (HOSPITAL_COMMUNITY): Payer: Self-pay | Admitting: Urology

## 2022-02-21 NOTE — Anesthesia Postprocedure Evaluation (Addendum)
Anesthesia Post Note  Patient: Andrew Hooper  Procedure(s) Performed: CYSTOSCOPY WITH RETROGRADE PYELOGRAM, URETEROSCOPY AND STENT EXCHANGE (Bilateral) HOLMIUM LASER APPLICATION (Bilateral)     Patient location during evaluation: PACU Anesthesia Type: General Level of consciousness: awake and alert Pain management: pain level controlled Vital Signs Assessment: post-procedure vital signs reviewed and stable Respiratory status: spontaneous breathing, nonlabored ventilation, respiratory function stable and patient connected to nasal cannula oxygen Cardiovascular status: blood pressure returned to baseline and stable Postop Assessment: no apparent nausea or vomiting Anesthetic complications: no   No notable events documented.  Last Vitals:  Vitals:   02/20/22 1145 02/20/22 1207  BP: 115/84 124/79  Pulse: 77 75  Resp: 13 16  Temp: 36.7 C 37.1 C  SpO2: 93% 96%    Last Pain:  Vitals:   02/20/22 1207  TempSrc: Oral  PainSc: 0-No pain                 Santa Lighter

## 2022-03-06 ENCOUNTER — Other Ambulatory Visit (INDEPENDENT_AMBULATORY_CARE_PROVIDER_SITE_OTHER): Payer: 59

## 2022-03-06 ENCOUNTER — Ambulatory Visit (INDEPENDENT_AMBULATORY_CARE_PROVIDER_SITE_OTHER): Payer: 59 | Admitting: Gastroenterology

## 2022-03-06 ENCOUNTER — Encounter: Payer: Self-pay | Admitting: Gastroenterology

## 2022-03-06 VITALS — BP 116/78 | HR 89 | Ht 71.0 in | Wt 265.5 lb

## 2022-03-06 DIAGNOSIS — K760 Fatty (change of) liver, not elsewhere classified: Secondary | ICD-10-CM

## 2022-03-06 DIAGNOSIS — E7220 Disorder of urea cycle metabolism, unspecified: Secondary | ICD-10-CM

## 2022-03-06 LAB — IBC + FERRITIN
Ferritin: 69.5 ng/mL (ref 22.0–322.0)
Iron: 37 ug/dL — ABNORMAL LOW (ref 42–165)
Saturation Ratios: 12 % — ABNORMAL LOW (ref 20.0–50.0)
TIBC: 308 ug/dL (ref 250.0–450.0)
Transferrin: 220 mg/dL (ref 212.0–360.0)

## 2022-03-06 LAB — GAMMA GT: GGT: 25 U/L (ref 7–51)

## 2022-03-06 LAB — AMMONIA: Ammonia: 34 umol/L (ref 11–35)

## 2022-03-06 NOTE — Patient Instructions (Signed)
If you are age 58 or older, your body mass index should be between 23-30. Your Body mass index is 37.03 kg/m. If this is out of the aforementioned range listed, please consider follow up with your Primary Care Provider.  If you are age 74 or younger, your body mass index should be between 19-25. Your Body mass index is 37.03 kg/m. If this is out of the aformentioned range listed, please consider follow up with your Primary Care Provider.   Your provider has requested that you go to the basement level for lab work before leaving today. Press "B" on the elevator. The lab is located at the first door on the left as you exit the elevator.  Follow up in a year.  Due to recent changes in healthcare laws, you may see the results of your imaging and laboratory studies on MyChart before your provider has had a chance to review them.  We understand that in some cases there may be results that are confusing or concerning to you. Not all laboratory results come back in the same time frame and the provider may be waiting for multiple results in order to interpret others.  Please give [place] 48 hours in order for your provider to thoroughly review all the results before contacting the office for clarification of your results.    The [place] [facility] providers would like to encourage you to use [facility] to communicate with providers for non-urgent requests or questions.  Due to long hold times on the telephone, sending your provider a message by [facility] may be a faster and more efficient way to get a response.  Please allow 48 business hours for a response.  Please remember that this is for non-urgent requests.   It was a pleasure to see you today!  Thank you for trusting me with your gastrointestinal care!    Scott E.Candis Schatz, MD

## 2022-03-06 NOTE — Progress Notes (Unsigned)
HPI : Andrew Hooper is a very pleasant 56 year old male with a history of anxiety, hypertension, arthritis and sleep apnea who is referred to [place] by Dr. Thayer Jew for for further evaluation of fatty liver.  The patient was also noted to have a mildly elevated ammonia level taken in April.  This was drawn because the patient was evaluated for excessive somnolence, which he attributed to taking too much Benadryl and melatonin. The patient had a CT scan to evaluate abdominal pain, which showed obstructing kidney stones.  CT scan was also notable for moderate hepatic steatosis. In 2021, the patient was having abrupt onset abdominal pain, and had ultrasound which was notable for stones, but also noted increased echogenicity of the liver. The patient has had persistently normal liver enzymes, except in the setting of his symptomatic gallstones in 2021. The patient rarely drinks alcohol, and denies ever being a regular or heavy drinker.  He has been overweight for most of his life.  He does not have diabetes, but has been told that he is "prediabetic". He says that his physical activity is limited by knee pain.  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 He thinks his diet is okay, but admits that his problem is grazing in the evenings after dinner.  He will frequently eat foods he knows he should not eat such as cookies or chips.  He has stopped drinking sodas, but does drink sweet tea on occasion (mixed with unsweetened tea).  Other than that episode in April, he denies any problems with somnolence or cognitive difficulties.  He denies symptoms of a "head fog".  No difficulties concentrating or remembering things.  He has a remote history of complicated diverticulitis and underwent sigmoid colonic resection.  He is up-to-date on his colon cancer screening, and reports undergoing a colonoscopy earlier this year  with Dr. Benson Norway.  He states he was recommended to repeat in 7 years. He denies any chronic GI symptoms such as abdominal pain, constipation diarrhea or blood in the stool.   Past Medical History:  Diagnosis Date   Anxiety    Arthritis    Complication of anesthesia    rash afterwords"   Diverticulitis    GERD (gastroesophageal reflux disease)    History of kidney stones    Hypertension    Reflux    Sleep apnea      Past Surgical History:  Procedure Laterality Date   BACK SURGERY     BACK SURGERY     CHOLECYSTECTOMY N/A 02/02/2020   Procedure: LAPAROSCOPIC CHOLECYSTECTOMY WITH INTRAOPERATIVE CHOLANGIOGRAM;  Surgeon: Kieth Brightly Arta Bruce, MD;  Location: [facility];  Service: General;  Laterality: N/A;   COLON SURGERY     COLON SURGERY     CYSTOSCOPY W/ URETERAL STENT PLACEMENT Bilateral 02/06/2022   Procedure: CYSTOSCOPY WITH RETROGRADE PYELOGRAM/URETERAL STENT PLACEMENT;  Surgeon: Alexis Frock, MD;  Location: [facility];  Service: Urology;  Laterality: Bilateral;   CYSTOSCOPY WITH RETROGRADE PYELOGRAM, URETEROSCOPY AND STENT PLACEMENT Bilateral 02/20/2022   Procedure: CYSTOSCOPY WITH RETROGRADE PYELOGRAM, URETEROSCOPY AND STENT EXCHANGE;  Surgeon: Alexis Frock, MD;  Location: [facility];  Service: Urology;  Laterality: Bilateral;  90 MINS   HOLMIUM LASER APPLICATION Bilateral 9/32/6712   Procedure: HOLMIUM LASER APPLICATION;  Surgeon: Alexis Frock, MD;  Location: [facility];  Service: Urology;  Laterality: Bilateral;   KNEE SURGERY  KNEE SURGERY     SHOULDER SURGERY     Family History  Problem Relation Age of Onset   Breast cancer Mother    Rectal cancer Mother    Deep vein thrombosis Mother    Stroke Father    Heart disease Father 34   Heart disease Brother 33   Social History   Tobacco Use   Smoking status: Never   Smokeless tobacco: Never  Vaping Use   Vaping Use: Never used  Substance Use Topics   Alcohol use: Yes    Comment: rarely   Drug use: No   Current  Outpatient Medications  Medication Sig Dispense Refill   acetaminophen (TYLENOL) 500 MG tablet Take 1,000 mg by mouth every 6 (six) hours as needed for moderate pain.     Cholecalciferol 50 MCG (2000 UT) CAPS Take 2,000 Units by mouth daily.     escitalopram (LEXAPRO) 10 MG tablet Take 10 mg by mouth daily.     irbesartan (AVAPRO) 300 MG tablet Take 1 tablet (300 mg total) by mouth daily. Call and schedule follow up appointment for further refills. [phone]. 1st attempt 30 tablet 0   NON FORMULARY Pt uses a cpap nightly     rosuvastatin (CRESTOR) 10 MG tablet TAKE 1 TABLET(10 MG) BY MOUTH DAILY 30 tablet 11   zolpidem (AMBIEN CR) 12.5 MG CR tablet Take 12.5 mg by mouth at bedtime as needed for sleep.     ketorolac (TORADOL) 10 MG tablet Take 1 tablet (10 mg total) by mouth every 8 (eight) hours as needed for moderate pain (or stent discomfort post-operatively). 20 tablet 0   oxyCODONE-acetaminophen (PERCOCET) 5-325 MG tablet Take 1 tablet by mouth every 6 (six) hours as needed for severe pain (post-operatively). 15 tablet 0   tamsulosin (FLOMAX) 0.4 MG CAPS capsule Take 1 capsule (0.4 mg total) by mouth daily as needed. For stent discomfort 30 capsule 1   No current facility-administered medications for this visit.   Allergies  Allergen Reactions   Sulfa Antibiotics Rash   Sulfamethoxazole Rash    Sulfa based antibiotics      Review of Systems: All systems reviewed and negative except where noted in HPI.    DG C-Arm 1-60 Min-No Report  Result Date: 02/20/2022 Fluoroscopy was utilized by the requesting physician.  No radiographic interpretation.   DG C-Arm 1-60 Min-No Report  Result Date: 02/20/2022 Fluoroscopy was utilized by the requesting physician.  No radiographic interpretation.   DG C-Arm 1-60 Min-No Report  Result Date: 02/06/2022 Fluoroscopy was utilized by the requesting physician.  No radiographic interpretation.   CT ABDOMEN PELVIS W CONTRAST  Result Date:  02/06/2022 CLINICAL DATA:  Left lower quadrant abdominal pain EXAM: CT ABDOMEN AND PELVIS WITH CONTRAST TECHNIQUE: Multidetector CT imaging of the abdomen and pelvis was performed using the standard protocol following bolus administration of intravenous contrast. RADIATION DOSE REDUCTION: This exam was performed according to the departmental dose-optimization program which includes automated exposure control, adjustment of the mA and/or kV according to patient size and/or use of iterative reconstruction technique. CONTRAST:  187m OMNIPAQUE IOHEXOL 300 MG/ML  SOLN COMPARISON:  01/31/2020 FINDINGS: Lower chest: No acute abnormality. Hepatobiliary: Moderate hepatic steatosis. Stable cyst within the left hepatic lobe. No enhancing intrahepatic mass. No intra or extrahepatic biliary ductal dilation. Status post cholecystectomy. Pancreas: Unremarkable Spleen: Unremarkable Adrenals/Urinary Tract: The adrenal glands are unremarkable. The kidneys are normal in size and position. Moderate left hydronephrosis has developed secondary to an obstructing 9 x 10  x 13 mm calculus within the left ureteropelvic junction. There is superimposed bilateral nonobstructing nephrolithiasis with multiple calculi measuring up to 6 mm within the kidneys bilaterally. No hydronephrosis on the right. No additional ureteral calculi. The bladder is unremarkable. Stomach/Bowel: Status post sigmoid colectomy. Stomach, small bowel, and large bowel are otherwise unremarkable. Appendix normal. No free intraperitoneal gas or fluid. Vascular/Lymphatic: Aortic atherosclerosis. No enlarged abdominal or pelvic lymph nodes. Reproductive: Prostate is unremarkable. Other: Tiny fat containing bilateral inguinal hernia Musculoskeletal: Degenerative changes are seen within the visualized thoracolumbar spine. Lytic lesion compatible with a probable vertebral hemangioma noted within the L1 vertebral body. No acute bone abnormality. No suspicious lytic or blastic bone  lesion. IMPRESSION: 1. Obstructing 9 x 10 x 13 mm calculus within the left ureteropelvic junction resulting in moderate left hydronephrosis. Superimposed bilateral nonobstructing nephrolithiasis. 2. Moderate hepatic steatosis. 3. Status post sigmoid colectomy. Aortic Atherosclerosis (ICD10-I70.0). Electronically Signed   By: Fidela Salisbury M.D.   On: 02/06/2022 00:10    Physical Exam: BP 116/78   Pulse 89   Ht '5\' 11"'$  (1.803 m)   Wt 265 lb 8 oz (120.4 kg)   BMI 37.03 kg/m  Constitutional: Pleasant,well-developed, Caucasian male in no acute distress. HEENT: Normocephalic and atraumatic. Conjunctivae are normal. No scleral icterus. Neck supple.  Cardiovascular: Normal rate, regular rhythm.  Pulmonary/chest: Effort normal and breath sounds normal. No wheezing, rales or rhonchi. Abdominal: Soft, nondistended, nontender. Bowel sounds active throughout. There are no masses palpable. No hepatomegaly. Extremities: no edema Neurological: Alert and oriented to person place and time. Skin: Skin is warm and dry. No rashes noted. Psychiatric: Normal mood and affect. Behavior is normal.  CBC    Component Value Date/Time   WBC 14.1 (H) 02/05/2022 1732   RBC 5.25 02/05/2022 1732   HGB 15.2 02/05/2022 1732   HCT 47.5 02/05/2022 1732   PLT 244 02/05/2022 1732   MCV 90.5 02/05/2022 1732   MCH 29.0 02/05/2022 1732   MCHC 32.0 02/05/2022 1732   RDW 14.6 02/05/2022 1732   LYMPHSABS 1.7 02/05/2022 1732   MONOABS 1.0 02/05/2022 1732   EOSABS 0.2 02/05/2022 1732   BASOSABS 0.1 02/05/2022 1732    CMP     Component Value Date/Time   NA 138 02/15/2022 0812   NA 141 04/02/2021 1029   K 4.4 02/15/2022 0812   CL 107 02/15/2022 0812   CO2 27 02/15/2022 0812   GLUCOSE 103 (H) 02/15/2022 0812   BUN 34 (H) 02/15/2022 0812   BUN 28 (H) 04/02/2021 1029   CREATININE 0.89 02/15/2022 0812   CALCIUM 8.9 02/15/2022 0812   PROT 7.4 02/05/2022 1732   PROT 6.9 05/10/2020 0938   ALBUMIN 4.3 02/05/2022 1732    ALBUMIN 4.5 05/10/2020 0938   AST 17 02/05/2022 1732   ALT 21 02/05/2022 1732   ALKPHOS 71 02/05/2022 1732   BILITOT 0.7 02/05/2022 1732   BILITOT 0.5 05/10/2020 0938   GFRNONAA >60 02/15/2022 0812   GFRAA >60 02/29/2020 2046   Component Ref Range & Units 5 mo ago  Ammonia 9 - 35 umol/L 48 High     Component Ref Range & Units 1 mo ago (02/05/22) 5 mo ago (09/09/21) 5 mo ago (09/09/21) 5 mo ago (09/09/21) 11 mo ago (04/02/21) 1 yr ago (05/10/20) 2 yr ago (02/29/20)  Sodium 135 - 145 mmol/L 140  140  140  140  141 R   139   Potassium 3.5 - 5.1 mmol/L 3.8  4.2  4.2  4.1  4.4 R   3.9   Chloride 98 - 111 mmol/L 106   105  104  104 R   103   CO2 22 - 32 mmol/L '25   26  26  26 '$ R   28   Glucose, Bld 70 - 99 mg/dL 109 High    125 High  CM  127 High  CM  94   105 High  CM   Comment: Glucose reference range applies only to samples taken after fasting for at least 8 hours.  BUN 6 - 20 mg/dL 29 High    32 High   31 High   28 High  R   22 High    Creatinine, Ser 0.61 - 1.24 mg/dL 1.05   0.82  0.85  1.00 R   0.92   Calcium 8.9 - 10.3 mg/dL 9.0   9.4  9.5  9.2 R   9.0   Total Protein 6.5 - 8.1 g/dL 7.4   6.8    6.9 R    Albumin 3.5 - 5.0 g/dL 4.3   4.3    4.5 R    AST 15 - 41 U/L 17   12 Low     15 R    ALT 0 - 44 U/L '21   22    16 '$ R    Alkaline Phosphatase 38 - 126 U/L 71   60    90 R, CM    Total Bilirubin 0.3 - 1.2 mg/dL 0.7   0.4    0.5 R    GFR, Estimated >60 mL/min >60   >60 CM  >60 CM      Comment: (NOTE)     Narrative & Impression CLINICAL DATA:  Sudden onset upper abdominal pain   EXAM: CT ABDOMEN AND PELVIS WITH CONTRAST   TECHNIQUE: Multidetector CT imaging of the abdomen and pelvis was performed using the standard protocol following bolus administration of intravenous contrast.   CONTRAST:  12m OMNIPAQUE IOHEXOL 300 MG/ML  SOLN   COMPARISON:  None.   FINDINGS: Lower chest: No acute pleural or parenchymal lung disease.   Hepatobiliary: No focal liver abnormality is seen.  No gallstones, gallbladder wall thickening, or biliary dilatation.   Pancreas: There is fat stranding surrounding the head of the pancreas consistent with acute uncomplicated pancreatitis. The pancreatic parenchyma enhances normally.   Spleen: Normal in size without focal abnormality.   Adrenals/Urinary Tract: There are bilateral nonobstructing renal calculi, largest on the right measuring 7 mm and on the left measuring 9 mm. No obstructive uropathy within either kidney. The ureters and bladder are grossly normal. Adrenals are unremarkable.   Stomach/Bowel: No bowel obstruction or ileus. Normal appendix right lower quadrant. Postsurgical changes from prior sigmoid colon resection and reanastomosis. No bowel wall thickening. Fat stranding surrounding the duodenum likely related to the pancreatitis described above.   Vascular/Lymphatic: There is minimal atherosclerosis of the distal aorta.   No pathologic adenopathy within the abdomen or pelvis.   Reproductive: Prostate is unremarkable.   Other: Trace free fluid in the right upper quadrant. No free intraperitoneal gas. No fluid collection, pseudocyst, or abscess.   Musculoskeletal: No acute or destructive bony lesions. Reconstructed images demonstrate no additional findings.   IMPRESSION: 1. Mild inflammatory changes surrounding the head of the pancreas, consistent with acute uncomplicated pancreatitis. 2. Bilateral nonobstructing renal calculi.     Electronically Signed   By: MRanda NgoM.D.   On: 01/31/2020 03:13  Narrative & Impression  CLINICAL DATA:  Upper abdominal pain   -------------------------------------------------------------------------------------------------------    EXAM: ABDOMEN ULTRASOUND COMPLETE   COMPARISON:  CT 01/31/2020   FINDINGS: Gallbladder: Small amount of sludge with small stones in the gallbladder. Normal wall thickness. Negative sonographic Murphy.   Common bile duct:  Diameter: 3 mm   Liver: Borderline to slightly enlarged. Within normal limits for echogenicity. Portal vein is patent on color Doppler imaging with normal direction of blood flow towards the liver.   IVC: No abnormality visualized.   Pancreas: Limited evaluation due to bowel gas.   Spleen: Size and appearance within normal limits.   Right Kidney: Length: 12.3 cm. Echogenicity within normal limits. No mass or hydronephrosis visualized.   Left Kidney: Length: 13.3 cm. Echogenicity within normal limits. No mass or hydronephrosis visualized.   Abdominal aorta: No aneurysm visualized.   Other findings: None.   IMPRESSION: 1. Small amount of sludge in the gallbladder with small stones. Negative for acute cholecystitis. 2. Borderline to slightly enlarged liver.     Electronically Signed   By: Donavan Foil M.D.   On: 01/31/2020 19:27     ASSESSMENT AND PLAN: 56 year old male with incidentally noted fatty liver, with historically normal liver enzymes.  He rarely drinks alcohol.  He is not diabetic, but he does have borderline elevated A1c.  He is obese.  His fatty liver is almost certainly from nonalcoholic fatty liver disease (NAFLD).  We will exclude other causes of chronic liver disease (viral, autoimmune, genetic/metabolic).  We discussed the pathophysiology and natural history of nonalcoholic fatty liver disease, to include the possibility of progression to NASH and cirrhosis.  Although most patients with fatty liver did not experience these complications, we cannot always predict who will.  We discussed the treatment for fatty liver, which revolves around weight loss through diet and exercise and avoidance of alcohol.  For his diet, I think the easiest change to make would be to eliminate the snacking after dinner.  This would eliminate a few 100 cal and a lot of sugar, which we discussed is becoming more the culprit in fatty liver disease. I recommended 10% weight loss as initial  goal.  Plan to follow-up in 1 year and assess his weight loss progression.  We will plan to repeat an ultrasound in a few years, and consider elastography at some point.  Currently, with his normal ALT and normal platelets, the likelihood of him having significant fibrosis is very low. The significance of his mildly elevated ammonia back in April is negligible.  I will repeat an ammonia level today.  The likelihood of him having hepatic encephalopathy is approximately 0.  Fatty liver, likely NAFLD -Evaluate for other causes of chronic liver disease (viral, autoimmune, genetic/metabolic) - Weight loss/diet modification as discussed above - follow up in 1 year  Elevated ammonia - Repeat ammonia level  Sicily Zaragoza E. Candis Schatz, MD [facility]   Deland Pretty, MD

## 2022-03-12 LAB — CERULOPLASMIN: Ceruloplasmin: 30 mg/dL (ref 18–36)

## 2022-03-12 LAB — ANTI-SMOOTH MUSCLE ANTIBODY, IGG: Actin (Smooth Muscle) Antibody (IGG): <20 U (ref <20)

## 2022-03-12 LAB — HEPATITIS A ANTIBODY, TOTAL: Hepatitis A AB,Total: NONREACTIVE

## 2022-03-12 LAB — HEPATITIS B SURFACE ANTIGEN: Hepatitis B Surface Ag: NONREACTIVE

## 2022-03-12 LAB — IGG: IgG (Immunoglobin G), Serum: 818 mg/dL (ref 600–1640)

## 2022-03-12 LAB — ALPHA-1-ANTITRYPSIN: A-1 Antitrypsin, Ser: 154 mg/dL (ref 83–199)

## 2022-03-12 LAB — HEPATITIS B SURFACE ANTIBODY,QUALITATIVE: Hep B S Ab: NONREACTIVE

## 2022-03-12 LAB — HEPATITIS C ANTIBODY: Hepatitis C Ab: NONREACTIVE

## 2022-03-12 LAB — MITOCHONDRIAL ANTIBODIES: Mitochondrial M2 Ab, IgG: <=20 U (ref <=20.0)

## 2022-03-13 ENCOUNTER — Ambulatory Visit (INDEPENDENT_AMBULATORY_CARE_PROVIDER_SITE_OTHER): Payer: 59 | Admitting: Podiatry

## 2022-03-13 DIAGNOSIS — M7751 Other enthesopathy of right foot: Secondary | ICD-10-CM

## 2022-03-13 NOTE — Progress Notes (Signed)
Subjective:  Patient ID: Andrew Hooper, male    DOB: Nov 17, 1965,  MRN: [id number]  Chief Complaint  Patient presents with   capsulitits     Patient states right foot is better since the last visit , patient states there is still some pain. No swelling or redness. Patient states injection did help    56 y.o. male presents with the above complaint.  Patient presents for follow-up of right submetatarsal 3 capsulitis.  Patient states the injection does help he still has some residual pain but this shoe gear modification and padding has helped some.  He denies any other acute complaints.   Review of Systems: Negative except as noted in the HPI. Denies N/V/F/Ch.  Past Medical History:  Diagnosis Date   Anxiety    Arthritis    Complication of anesthesia    rash afterwords"   Diverticulitis    GERD (gastroesophageal reflux disease)    History of kidney stones    Hypertension    Reflux    Sleep apnea     Current Outpatient Medications:    acetaminophen (TYLENOL) 500 MG tablet, Take 1,000 mg by mouth every 6 (six) hours as needed for moderate pain., Disp: , Rfl:    Cholecalciferol 50 MCG (2000 UT) CAPS, Take 2,000 Units by mouth daily., Disp: , Rfl:    escitalopram (LEXAPRO) 10 MG tablet, Take 10 mg by mouth daily., Disp: , Rfl:    irbesartan (AVAPRO) 300 MG tablet, Take 1 tablet (300 mg total) by mouth daily. Call and schedule follow up appointment for further refills. [phone]. 1st attempt, Disp: 30 tablet, Rfl: 0   NON FORMULARY, Pt uses a cpap nightly, Disp: , Rfl:    rosuvastatin (CRESTOR) 10 MG tablet, TAKE 1 TABLET(10 MG) BY MOUTH DAILY, Disp: 30 tablet, Rfl: 11   zolpidem (AMBIEN CR) 12.5 MG CR tablet, Take 12.5 mg by mouth at bedtime as needed for sleep., Disp: , Rfl:   Social History   Tobacco Use  Smoking Status Never  Smokeless Tobacco Never    Allergies  Allergen Reactions   Sulfa Antibiotics Rash   Sulfamethoxazole Rash    Sulfa based antibiotics     Objective:  There were no vitals filed for this visit. There is no height or weight on file to calculate BMI. Constitutional Well developed. Well nourished.  Vascular Dorsalis pedis pulses palpable bilaterally. Posterior tibial pulses palpable bilaterally. Capillary refill normal to all digits.  No cyanosis or clubbing noted. Pedal hair growth normal.  Neurologic Normal speech. Oriented to person, place, and time. Epicritic sensation to light touch grossly present bilaterally.  Dermatologic Nails well groomed and normal in appearance. No open wounds. No skin lesions.  Orthopedic: Pain on palpation right third metatarsophalangeal joint pain with range of motion of the joint.  No signs of stump neuroma clinically appreciated.  Negative Mulder's click noted in the second interspace.   Radiographs: None Assessment:   1. Capsulitis of metatarsophalangeal (MTP) joint of right foot     Plan:  Patient was evaluated and treated and all questions answered.  Right third MTP capsulitis versus stump neuroma -All questions and concerns were discussed with the patient in extensive detail given the amount of pain that he is experiencing benefit from a steroid injection at decreasing inflammatory component associate with pain.  Patient agrees with plan like to proceed with steroid injection -A second steroid injection was performed at right third MTP joint using 1% plain Lidocaine and 10 mg of Kenalog. This was  well tolerated. -I discussed shoe gear modification extensive detail as well -Also incorporate some more padding to see if that helps.   No follow-ups on file.

## 2022-03-13 NOTE — Progress Notes (Signed)
Mr. Martenson, All of your labs looked good.  There were no abnormalities that would cause chronic liver disease.  This confirms our suspicion for your fatty liver being secondary to nonalcoholic fatty liver disease (NAFLD).  Please continue to work on the things we discussed regarding her diet and exercise habits. You are not immune to hepatitis A or B.  There are vaccines available for these viruses if you desire.  We can provide you with these vaccines, just let us know.

## 2022-03-19 ENCOUNTER — Encounter: Payer: Self-pay | Admitting: Podiatry

## 2022-04-09 ENCOUNTER — Inpatient Hospital Stay: Admission: RE | Admit: 2022-04-09 | Payer: 59 | Source: Ambulatory Visit

## 2022-04-09 ENCOUNTER — Ambulatory Visit (HOSPITAL_BASED_OUTPATIENT_CLINIC_OR_DEPARTMENT_OTHER): Payer: 59

## 2022-04-10 ENCOUNTER — Ambulatory Visit (HOSPITAL_BASED_OUTPATIENT_CLINIC_OR_DEPARTMENT_OTHER)
Admission: RE | Admit: 2022-04-10 | Discharge: 2022-04-10 | Disposition: A | Discharge status: Home / Self Care | Payer: 59 | Source: Ambulatory Visit | Attending: Interventional Cardiology | Admitting: Interventional Cardiology

## 2022-04-10 DIAGNOSIS — I7121 Aneurysm of the ascending aorta, without rupture: Secondary | ICD-10-CM | POA: Insufficient documentation

## 2022-04-10 MED ORDER — IOHEXOL 350 MG/ML SOLN
100.0000 mL | Freq: Once | INTRAVENOUS | Status: AC | PRN
  Administered 2022-04-10: 100 mL via INTRAVENOUS

## 2022-04-11 LAB — POCT I-STAT CREATININE: Creatinine, Ser: 0.8 mg/dL (ref 0.61–1.24)

## 2022-04-12 ENCOUNTER — Other Ambulatory Visit: Payer: Self-pay | Admitting: Interventional Cardiology

## 2022-04-12 ENCOUNTER — Other Ambulatory Visit: Payer: Self-pay | Admitting: *Deleted

## 2022-04-12 DIAGNOSIS — I712 Thoracic aortic aneurysm, without rupture, unspecified: Secondary | ICD-10-CM

## 2022-04-16 NOTE — Progress Notes (Unsigned)
Office Visit    Patient Name: Samul Mcinroy Date of Encounter: 04/18/2022  Primary Care Provider:  Deland Pretty, MD Primary Cardiologist:  Larae Grooms, MD Primary Electrophysiologist: None  Chief Complaint    Kervens Roper is a 56 y.o. male with PMH of nonobstructive CAD, premature CAD, HTN, GERD who presents today for 1 year follow-up of coronary artery disease.  Past Medical History    Past Medical History:  Diagnosis Date   Anxiety    Arthritis    Complication of anesthesia    rash afterwords"   Diverticulitis    GERD (gastroesophageal reflux disease)    History of kidney stones    Hypertension    Reflux    Sleep apnea    Past Surgical History:  Procedure Laterality Date   BACK SURGERY     BACK SURGERY     CHOLECYSTECTOMY N/A 02/02/2020   Procedure: LAPAROSCOPIC CHOLECYSTECTOMY WITH INTRAOPERATIVE CHOLANGIOGRAM;  Surgeon: Kieth Brightly Arta Bruce, MD;  Location: [facility];  Service: General;  Laterality: N/A;   COLON SURGERY     COLON SURGERY     CYSTOSCOPY W/ URETERAL STENT PLACEMENT Bilateral 02/06/2022   Procedure: CYSTOSCOPY WITH RETROGRADE PYELOGRAM/URETERAL STENT PLACEMENT;  Surgeon: Alexis Frock, MD;  Location: [facility];  Service: Urology;  Laterality: Bilateral;   CYSTOSCOPY WITH RETROGRADE PYELOGRAM, URETEROSCOPY AND STENT PLACEMENT Bilateral 02/20/2022   Procedure: CYSTOSCOPY WITH RETROGRADE PYELOGRAM, URETEROSCOPY AND STENT EXCHANGE;  Surgeon: Alexis Frock, MD;  Location: [facility];  Service: Urology;  Laterality: Bilateral;  90 MINS   HOLMIUM LASER APPLICATION Bilateral 0/24/0973   Procedure: HOLMIUM LASER APPLICATION;  Surgeon: Alexis Frock, MD;  Location: [facility];  Service: Urology;  Laterality: Bilateral;   KNEE SURGERY     KNEE SURGERY     SHOULDER SURGERY      Allergies  Allergies  Allergen Reactions   Sulfa Antibiotics Rash   Sulfamethoxazole Rash    Sulfa based antibiotics     History of Present Illness    Dareon Nunziato  is  a 56 year old male with the above mention past medical history who presents today for 1 year follow-up of coronary artery disease.  Mr. Kley was recently seen by Dr. Irish Lack in 10/2013 for evaluation of chest pain.  He reports a negative stress test several years ago and stress test was recommended but denied by patient's insurance.  2D echo was completed with EF of 60 to 65%, mild concentric LVH, with trivial MV and AV regurgitation, mild aortic root dilation.  He had a CT calcium scoring completed 08/2019 that demonstrated a score of 52 with dilated ascending aorta measuring 44 mm.  He was recommended to complete yearly CT angiograms for surveillance.  Mr. Ran presents today for annual follow-up alone.  Since last being seen in the office patient reports he has been doing well with no new cardiac complaints.  His blood pressure today was well controlled at 110/68.  He did have a recent ED visit back in September when he incidentally took Benadryl and melatonin with a presyncopal event.  Today he denies any dizziness or presyncope since this occurred.  He is compliant with his current medication regimen and denies any adverse reactions or side effects.  During our visit we discussed primary prevention for coronary artery disease and also discussed starting a walking program for physical activity.  Patient denies chest pain, palpitations, dyspnea, PND, orthopnea, nausea, vomiting, dizziness, syncope, edema, weight gain, or early satiety.   Home Medications  Current Outpatient Medications  Medication Sig Dispense Refill   acetaminophen (TYLENOL) 500 MG tablet Take 1,000 mg by mouth every 6 (six) hours as needed for moderate pain.     Cholecalciferol 50 MCG (2000 UT) CAPS Take 2,000 Units by mouth daily.     escitalopram (LEXAPRO) 10 MG tablet Take 10 mg by mouth daily.     irbesartan (AVAPRO) 300 MG tablet Take 1 tablet (300 mg total) by mouth daily. Call and schedule follow up appointment for  further refills. [phone]. 1st attempt 30 tablet 0   NON FORMULARY Pt uses a cpap nightly     rosuvastatin (CRESTOR) 10 MG tablet Take 1 tablet (10 mg total) by mouth daily. Please call [phone] to schedule an appointment for future refills. Thank you. 1st attempt. 30 tablet 0   zolpidem (AMBIEN CR) 12.5 MG CR tablet Take 12.5 mg by mouth at bedtime as needed for sleep.     No current facility-administered medications for this visit.     Review of Systems  Please see the history of present illness.    (+) Edema and right lower extremity chronic  All other systems reviewed and are otherwise negative except as noted above.  Physical Exam    Wt Readings from Last 3 Encounters:  04/18/22 273 lb 3.2 oz (123.9 kg)  03/06/22 265 lb 8 oz (120.4 kg)  02/20/22 262 lb (118.8 kg)   VS: Vitals:   04/18/22 0832  BP: 110/68  Pulse: 60  SpO2: 99%  ,Body mass index is 37.05 kg/m.  Constitutional:      Appearance: Healthy appearance. Not in distress.  Neck:     Vascular: JVD normal.  Pulmonary:     Effort: Pulmonary effort is normal.     Breath sounds: No wheezing. No rales. Diminished in the bases Cardiovascular:     Normal rate. Regular rhythm. Normal S1. Normal S2.      Murmurs: There is no murmur.  Edema:    Right lower extremity edema Abdominal:     Palpations: Abdomen is soft non tender. There is no hepatomegaly.  He said skin:    General: Skin is warm and dry.  Neurological:     General: No focal deficit present.     Mental Status: Alert and oriented to person, place and time.     Cranial Nerves: Cranial nerves are intact.  EKG/LABS/Other Studies Reviewed    ECG personally reviewed by me today -none completed today  Lab Results  Component Value Date   WBC 14.1 (H) 02/05/2022   HGB 15.2 02/05/2022   HCT 47.5 02/05/2022   MCV 90.5 02/05/2022   PLT 244 02/05/2022   Lab Results  Component Value Date   CREATININE 0.80 04/10/2022   BUN 34 (H) 02/15/2022   NA  138 02/15/2022   K 4.4 02/15/2022   CL 107 02/15/2022   CO2 27 02/15/2022   Lab Results  Component Value Date   ALT 21 02/05/2022   AST 17 02/05/2022   ALKPHOS 71 02/05/2022   BILITOT 0.7 02/05/2022   Lab Results  Component Value Date   CHOL 116 05/10/2020   HDL 42 05/10/2020   LDLCALC 59 05/10/2020   TRIG 74 05/10/2020   CHOLHDL 2.8 05/10/2020    Lab Results  Component Value Date   HGBA1C 5.3 06/20/2011    Assessment & Plan    1.  Nonobstructive CAD: -Cardiac CTA completed with calcium score noted at score of 52 with no significant stenosis seen  with FFR. -Continue GDMT with Crestor 10 mg we will advise starting baby aspirin based on results of lipid panel.  2.  Essential hypertension: -Patient's blood pressure today was well controlled at 110/68 -Continue irbesartan 300 mg daily  3.  Dilated aortic root: -Yearly CT scans recommended and GDMT with Crestor 10 mg -Patient advised to avoid heavy lifting and straining.  4.  Hyperlipidemia: -Last LDL was 59 on 04/2021 we will recheck LFTs and lipids -Continue statin therapy as noted above     5.  Obesity: Patient's current BMI is 37.05 -He was advised to increase physical activity at least 150 minutes/week as tolerated  Disposition: Follow-up with Larae Grooms, MD or APP in 12 months    Medication Adjustments/Labs and Tests Ordered: Current medicines are reviewed at length with the patient today.  Concerns regarding medicines are outlined above.   Signed, Mable Fill, Marissa Nestle, NP 04/18/2022, 9:18 AM [facility]  Note:  This document was prepared using Dragon voice recognition software and may include unintentional dictation errors.

## 2022-04-18 ENCOUNTER — Ambulatory Visit: Payer: 59 | Attending: Nurse Practitioner | Admitting: Nurse Practitioner

## 2022-04-18 ENCOUNTER — Encounter: Payer: Self-pay | Admitting: Nurse Practitioner

## 2022-04-18 VITALS — BP 110/68 | HR 60 | Ht 72.0 in | Wt 273.2 lb

## 2022-04-18 DIAGNOSIS — E6609 Other obesity due to excess calories: Secondary | ICD-10-CM | POA: Diagnosis not present

## 2022-04-18 DIAGNOSIS — I7121 Aneurysm of the ascending aorta, without rupture: Secondary | ICD-10-CM | POA: Diagnosis not present

## 2022-04-18 DIAGNOSIS — E785 Hyperlipidemia, unspecified: Secondary | ICD-10-CM

## 2022-04-18 DIAGNOSIS — I10 Essential (primary) hypertension: Secondary | ICD-10-CM

## 2022-04-18 DIAGNOSIS — Z6837 Body mass index (BMI) 37.0-37.9, adult: Secondary | ICD-10-CM

## 2022-04-18 NOTE — Patient Instructions (Addendum)
Medication Instructions:  Your physician recommends that you continue on your current medications as directed. Please refer to the Current Medication list given to you today.  *If you need a refill on your cardiac medications before your next appointment, please call your pharmacy*   Lab Work: At convenience complete [place] If you have labs (blood work) drawn today and your tests are completely normal, you will receive your results only by: [place] (if you have MyChart) OR A paper copy in the mail If you have any lab test that is abnormal or we need to change your treatment, we will call you to review the results.   Testing/Procedures: NONE ORDERED   Follow-Up: At [facility], you and your health needs are our priority.  As part of our continuing mission to provide you with exceptional heart care, we have created designated Provider Care Teams.  These Care Teams include your primary Cardiologist (physician) and Advanced Practice Providers (APPs -  Physician Assistants and Nurse Practitioners) who all work together to provide you with the care you need, when you need it.  We recommend signing up for the patient portal called "MyChart".  Sign up information is provided on this After Visit Summary.  MyChart is used to connect with patients for Virtual Visits (Telemedicine).  Patients are able to view lab/test results, encounter notes, upcoming appointments, etc.  Non-urgent messages can be sent to your provider as well.   To learn more about what you can do with MyChart, go to [email or web address].    Your next appointment:   12 month(s)  The format for your next appointment:   In Person  Provider:   Jettie Booze, MD  Other Instructions   Important Information About Sugar

## 2022-05-12 ENCOUNTER — Other Ambulatory Visit: Payer: Self-pay | Admitting: Interventional Cardiology

## 2022-11-13 ENCOUNTER — Other Ambulatory Visit: Payer: Self-pay | Admitting: Interventional Cardiology

## 2023-03-13 ENCOUNTER — Other Ambulatory Visit: Payer: Self-pay | Admitting: Interventional Cardiology

## 2023-03-13 DIAGNOSIS — I712 Thoracic aortic aneurysm, without rupture, unspecified: Secondary | ICD-10-CM

## 2023-03-28 ENCOUNTER — Telehealth: Payer: Self-pay | Admitting: Nurse Practitioner

## 2023-03-28 ENCOUNTER — Other Ambulatory Visit: Payer: Self-pay | Admitting: *Deleted

## 2023-03-28 DIAGNOSIS — Z01812 Encounter for preprocedural laboratory examination: Secondary | ICD-10-CM

## 2023-03-28 DIAGNOSIS — I7121 Aneurysm of the ascending aorta, without rupture: Secondary | ICD-10-CM

## 2023-03-28 NOTE — Telephone Encounter (Signed)
I spoke with patient and let him know he would need BMP prior to CT Scan.  He will stop by [facility] office for lab work.  Patient to follow with Dr Allyson Sabal as Dr Eldridge Dace is no longer with the practice

## 2023-03-28 NOTE — Telephone Encounter (Signed)
Patient is calling stating he was advised when scheduling his CT he needed to have labs performed prior.  Patient is requesting a callback to confirm this.   Patient states he also received an additional call after CT was scheduling about scheduling the CT.  Based on order it appears there is two placed for the same thing causing pt to get an additional call for scheduling.  Please advise.

## 2023-04-02 ENCOUNTER — Other Ambulatory Visit: Payer: Self-pay | Admitting: Interventional Cardiology

## 2023-04-07 LAB — BASIC METABOLIC PANEL
BUN/Creatinine Ratio: 30 — ABNORMAL HIGH (ref 9–20)
BUN: 27 mg/dL — ABNORMAL HIGH (ref 6–24)
CO2: 28 mmol/L (ref 20–29)
Calcium: 9.4 mg/dL (ref 8.7–10.2)
Chloride: 103 mmol/L (ref 96–106)
Creatinine, Ser: 0.91 mg/dL (ref 0.76–1.27)
Glucose: 110 mg/dL — ABNORMAL HIGH (ref 70–99)
Potassium: 4.6 mmol/L (ref 3.5–5.2)
Sodium: 139 mmol/L (ref 134–144)
eGFR: 97 mL/min/{1.73_m2} (ref >59)

## 2023-04-11 ENCOUNTER — Ambulatory Visit (HOSPITAL_BASED_OUTPATIENT_CLINIC_OR_DEPARTMENT_OTHER)
Admission: RE | Admit: 2023-04-11 | Discharge: 2023-04-11 | Disposition: A | Discharge status: Home / Self Care | Payer: 59 | Source: Ambulatory Visit | Attending: Interventional Cardiology | Admitting: Interventional Cardiology

## 2023-04-11 DIAGNOSIS — I712 Thoracic aortic aneurysm, without rupture, unspecified: Secondary | ICD-10-CM | POA: Insufficient documentation

## 2023-04-11 MED ORDER — IOHEXOL 350 MG/ML SOLN
75.0000 mL | Freq: Once | INTRAVENOUS | Status: AC | PRN
  Administered 2023-04-11: 75 mL via INTRAVENOUS

## 2023-04-18 ENCOUNTER — Other Ambulatory Visit (HOSPITAL_BASED_OUTPATIENT_CLINIC_OR_DEPARTMENT_OTHER): Payer: 59

## 2023-06-11 ENCOUNTER — Ambulatory Visit: Payer: 59 | Attending: Cardiovascular Disease | Admitting: Cardiovascular Disease

## 2023-06-11 ENCOUNTER — Encounter: Payer: Self-pay | Admitting: Cardiovascular Disease

## 2023-06-11 VITALS — BP 148/80 | HR 85 | Ht 71.0 in | Wt 286.0 lb

## 2023-06-11 DIAGNOSIS — Z6837 Body mass index (BMI) 37.0-37.9, adult: Secondary | ICD-10-CM

## 2023-06-11 DIAGNOSIS — E66812 Obesity, class 2: Secondary | ICD-10-CM | POA: Diagnosis not present

## 2023-06-11 DIAGNOSIS — G4733 Obstructive sleep apnea (adult) (pediatric): Secondary | ICD-10-CM | POA: Diagnosis not present

## 2023-06-11 DIAGNOSIS — E782 Mixed hyperlipidemia: Secondary | ICD-10-CM

## 2023-06-11 DIAGNOSIS — I2510 Atherosclerotic heart disease of native coronary artery without angina pectoris: Secondary | ICD-10-CM | POA: Insufficient documentation

## 2023-06-11 DIAGNOSIS — I712 Thoracic aortic aneurysm, without rupture, unspecified: Secondary | ICD-10-CM | POA: Insufficient documentation

## 2023-06-11 DIAGNOSIS — I7121 Aneurysm of the ascending aorta, without rupture: Secondary | ICD-10-CM

## 2023-06-11 DIAGNOSIS — E785 Hyperlipidemia, unspecified: Secondary | ICD-10-CM | POA: Insufficient documentation

## 2023-06-11 DIAGNOSIS — Z8249 Family history of ischemic heart disease and other diseases of the circulatory system: Secondary | ICD-10-CM | POA: Diagnosis not present

## 2023-06-11 DIAGNOSIS — I10 Essential (primary) hypertension: Secondary | ICD-10-CM

## 2023-06-11 DIAGNOSIS — E6609 Other obesity due to excess calories: Secondary | ICD-10-CM

## 2023-06-11 NOTE — Assessment & Plan Note (Signed)
 History of essential hypertension blood pressure measured today at 148/80.  He is on Avapro.  He says his blood pressure is usually lower than this at home.

## 2023-06-11 NOTE — Assessment & Plan Note (Signed)
 History of CAD by CTA performed 04/03/2020.  His coronary calcium score was 52.  He had no significant CAD by FFR analysis.

## 2023-06-11 NOTE — Assessment & Plan Note (Signed)
 History of hyperlipidemia on statin therapy with lipid profile performed 11/13/2022 revealing total cholesterol 113, LDL 55 and a 39, acceptable for secondary prevention.

## 2023-06-11 NOTE — Assessment & Plan Note (Signed)
 Recent CTA performed 04/11/2023 revealed his thoracic aorta measured 4.1 cm.  This has been followed by his PCP.

## 2023-06-11 NOTE — Assessment & Plan Note (Signed)
 On CPAP. ?

## 2023-06-11 NOTE — Progress Notes (Signed)
 06/11/2023 Andrew Hooper   [birth year]  [id number]  Primary Physician Andrew Nottingham, MD Primary Cardiologist: Andrew JINNY [name] MD GENI Andrew Hooper  HPI:  Andrew Hooper is a 58 y.o. 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  He was referred by Dr. Clarice, his PCP, to be established since his prior cardiologist, Dr. Dann, has retired.  His risk factors include treated hypertension and hyperlipidemia.  He has a strong family history of heart disease with his father and 3 brothers all who have had intervention for ischemic heart disease.  He is never had a heart attack or stroke.  He denies chest pain or shortness of breath.  He does have an elevated coronary calcium  score of 52 with subsequent FFR analysis that showed no significant disease Hooper also has a small ascending thoracic aortic aneurysm which measured 41 mm by recent CTA performed 04/11/2023.  He has had multiple surgeries in the past including cholecystectomy, recent wrist and shoulder surgery apparently he needs a total knee replacement.  He also has obstructive sleep apnea on CPAP.   Current Meds  Medication Sig   Cholecalciferol 50 MCG (2000 UT) CAPS Take 2,000 Units by mouth daily.   escitalopram  (LEXAPRO ) 10 MG tablet Take 10 mg by mouth daily.   irbesartan  (AVAPRO ) 300 MG tablet TAKE 1 TABLET(300 MG) BY MOUTH DAILY   Naproxen Sod-diphenhydrAMINE  (ALEVE PM PO) Take by mouth.   NON FORMULARY Pt uses a cpap nightly   rosuvastatin  (CRESTOR ) 10 MG tablet TAKE 1 TABLET(10 MG) BY MOUTH DAILY   zolpidem (AMBIEN CR) 12.5 MG CR tablet Take 12.5 mg by mouth at bedtime as needed for sleep.     Allergies  Allergen Reactions   Sulfa Antibiotics Rash   Sulfamethoxazole Rash    Sulfa based antibiotics     Social History   Socioeconomic History   Marital status: Married    Spouse name: Not on file   Number of children: Not on file    Years of education: Not on file   Highest education level: Not on file  Occupational History   Not on file  Tobacco Use   Smoking status: Never   Smokeless tobacco: Never  Vaping Use   Vaping status: Never Used  Substance and Sexual Activity   Alcohol use: Yes    Comment: rarely   Drug use: No   Sexual activity: Yes  Other Topics Concern   Not on file  Social History Narrative   Not on file   Social Drivers of Health   Financial Resource Strain: Not on file  Food Insecurity: No Food Insecurity (02/06/2022)   Hunger Vital Sign    Worried About Running Out of Food in the Last Year: Never true    Ran Out of Food in the Last Year: Never true  Transportation Needs: No Transportation Needs (02/06/2022)   PRAPARE - Administrator, Civil Service (Medical): No    Lack of Transportation (Non-Medical): No  Physical Activity: Not on file  Stress: Not on file  Social Connections: Unknown (10/15/2021)   [facility], [facility]   Social Network    Social Network: Not on file  Intimate Partner Violence: Not At Risk (02/06/2022)   Humiliation, Afraid, Rape, and Kick questionnaire    Fear of Current or Ex-Partner: No    Emotionally Abused: No    Physically Abused:  No    Sexually Abused: No     Review of Systems: General: negative for chills, fever, night sweats or weight changes.  Cardiovascular: negative for chest pain, dyspnea on exertion, edema, orthopnea, palpitations, paroxysmal nocturnal dyspnea or shortness of breath Dermatological: negative for rash Respiratory: negative for cough or wheezing Urologic: negative for hematuria Abdominal: negative for nausea, vomiting, diarrhea, bright red blood per rectum, melena, or hematemesis Neurologic: negative for visual changes, syncope, or dizziness All other systems reviewed and are otherwise negative except as noted above.    Blood pressure (!) 148/80, pulse 85, height 5' 11 (1.803 m), weight 286 lb  (129.7 kg), SpO2 95%.  General appearance: alert and no distress Neck: no adenopathy, no carotid bruit, no JVD, supple, symmetrical, trachea midline, and thyroid  not enlarged, symmetric, no tenderness/mass/nodules Lungs: clear to auscultation bilaterally Heart: regular rate and rhythm, S1, S2 normal, no murmur, click, rub or gallop Extremities: extremities normal, atraumatic, no cyanosis or edema Pulses: 2+ and symmetric Skin: Skin color, texture, turgor normal. No rashes or lesions Neurologic: Grossly normal  EKG EKG Interpretation Date/Time:  Wednesday June 11 2023 11:08:14 EST Ventricular Rate:  85 PR Interval:  196 QRS Duration:  88 QT Interval:  360 QTC Calculation: 428 R Axis:   34  Text Interpretation: Normal sinus rhythm Normal ECG When compared with ECG of 06-Feb-2022 00:50, PREVIOUS ECG IS PRESENT Confirmed by Court Carrier [phone]) on 06/11/2023 11:13:48 AM    ASSESSMENT AND PLAN:   Family history of ischemic heart disease Family history of heart disease with 3 brothers of father also had ischemic heart disease and intervention.  OSA (obstructive sleep apnea) On CPAP  Essential hypertension History of essential hypertension blood pressure measured today at 148/80.  He is on Avapro .  He says his blood pressure is usually lower than this at home.  Obesity, unspecified BMI of 40.  Because of some orthopedic issues he is unable to adequately exercise.  I am going to refer him to [facility] diet and wellness for physician assisted weight loss.  Coronary artery disease History of CAD by CTA performed 04/03/2020.  His coronary calcium  score was 52.  He had no significant CAD by FFR analysis.  Thoracic aortic aneurysm [facility]) Recent CTA performed 04/11/2023 revealed his thoracic aorta measured 4.1 cm.  This has been followed by his PCP.     Carrier DOROTHA Court MD FACP,FACC,FAHA, [facility] 06/11/2023 11:29 AM

## 2023-06-11 NOTE — Assessment & Plan Note (Signed)
 BMI of 40.  Because of some orthopedic issues he is unable to adequately exercise.  I am going to refer him to [facility] diet and wellness for physician assisted weight loss.

## 2023-06-11 NOTE — Patient Instructions (Signed)
 Medication Instructions:  Your physician recommends that you continue on your current medications as directed. Please refer to the Current Medication list given to you today.  *If you need a refill on your cardiac medications before your next appointment, please call your pharmacy*   Testing/Procedures: Non-Cardiac CT Angiography (CTA) chest/aorts, is a special type of CT scan that uses a computer to produce multi-dimensional views of major blood vessels throughout the body. In CT angiography, a contrast material is injected through an IV to help visualize the blood vessels **To be repeated in November 2025**   Follow-Up: At [facility], you and your health needs are our priority.  As part of our continuing mission to provide you with exceptional heart care, we have created designated Provider Care Teams.  These Care Teams include your primary Cardiologist (physician) and Advanced Practice Providers (APPs -  Physician Assistants and Nurse Practitioners) who all work together to provide you with the care you need, when you need it.  We recommend signing up for the patient portal called MyChart.  Sign up information is provided on this After Visit Summary.  MyChart is used to connect with patients for Virtual Visits (Telemedicine).  Patients are able to view lab/test results, encounter notes, upcoming appointments, etc.  Non-urgent messages can be sent to your provider as well.   To learn more about what you can do with MyChart, go to [email or web address].    Your next appointment:   12 month(s)  Provider:   Dorn [name], MD

## 2023-06-11 NOTE — Assessment & Plan Note (Signed)
 Family history of heart disease with 3 brothers of father also had ischemic heart disease and intervention.

## 2023-07-29 ENCOUNTER — Other Ambulatory Visit: Payer: Self-pay | Admitting: Interventional Cardiology

## 2023-07-29 ENCOUNTER — Encounter (INDEPENDENT_AMBULATORY_CARE_PROVIDER_SITE_OTHER): Payer: Self-pay

## 2024-01-12 ENCOUNTER — Ambulatory Visit: Payer: Self-pay

## 2024-01-12 NOTE — Telephone Encounter (Signed)
 FYI Only or Action Required?: FYI only for provider.  Patient was last seen in primary care on na.  Called Nurse Triage reporting Otalgia.  Symptoms began a week ago.  Interventions attempted: Nothing.  Symptoms are: gradually worsening.  Triage Disposition: See Physician Within 24 Hours  Patient/caregiver understands and will follow disposition?: Yes              Copied from CRM #[id number]. Topic: Clinical - Red Word Triage >> Jan 12, 2024 11:46 AM Adelita BRAVO wrote: [facility] that prompted transfer to Nurse Triage: Possible right ear infection. Patient is now experiencing pain on the back of head and down neck. Symptoms going on for 5-6 days. Reason for Disposition  Earache  (Exceptions: Brief ear pain of lasting less than 60 minutes, or earache occurring during air travel.)  Answer Assessment - Initial Assessment Questions 1. LOCATION: Which ear is involved?     Right ear pain 2. ONSET: When did the ear pain start?      Week ago 3. SEVERITY: How bad is the pain?  (Scale 1-10; mild, moderate or severe)     moderate 4. URI SYMPTOMS: Do you have a runny nose or cough?     States may have RSV 5. FEVER: Do you have a fever? If Yes, ask: What is your temperature, how was it measured, and when did it start?     denies 6. CAUSE: Have you been swimming recently?, How often do you use Q-TIPS?, Have you had any recent air travel or scuba diving?     Yes, qtip 7. OTHER SYMPTOMS: Do you have any other symptoms? (e.g., decreased hearing, dizziness, headache, stiff neck, vomiting)     Headache, light headed. 8. PREGNANCY: Is there any chance you are pregnant? When was your last menstrual period?     na  Protocols used: [place]

## 2024-01-27 ENCOUNTER — Ambulatory Visit: Payer: Self-pay | Admitting: Cardiovascular Disease

## 2024-03-29 ENCOUNTER — Other Ambulatory Visit: Payer: Self-pay | Admitting: Internal Medicine

## 2024-03-29 DIAGNOSIS — I7121 Aneurysm of the ascending aorta, without rupture: Secondary | ICD-10-CM

## 2024-03-31 ENCOUNTER — Other Ambulatory Visit

## 2024-04-08 ENCOUNTER — Other Ambulatory Visit: Payer: Self-pay | Admitting: Cardiovascular Disease

## 2024-04-09 MED ORDER — ROSUVASTATIN CALCIUM 10 MG PO TABS: 10.0000 mg | ORAL_TABLET | Freq: Every day | ORAL | 0 refills | Status: DC

## 2024-04-12 ENCOUNTER — Ambulatory Visit
Admission: RE | Admit: 2024-04-12 | Discharge: 2024-04-12 | Disposition: A | Discharge status: Home / Self Care | Source: Ambulatory Visit | Attending: Internal Medicine | Admitting: Internal Medicine

## 2024-04-12 DIAGNOSIS — I7121 Aneurysm of the ascending aorta, without rupture: Secondary | ICD-10-CM

## 2024-04-12 MED ORDER — IOPAMIDOL (ISOVUE-370) INJECTION 76%
80.0000 mL | Freq: Once | INTRAVENOUS | Status: AC | PRN
  Administered 2024-04-12: 80 mL via INTRAVENOUS

## 2024-06-09 ENCOUNTER — Encounter: Payer: Self-pay | Admitting: Cardiovascular Disease

## 2024-06-12 ENCOUNTER — Other Ambulatory Visit: Payer: Self-pay | Admitting: Cardiovascular Disease

## 2024-06-14 ENCOUNTER — Other Ambulatory Visit: Payer: Self-pay | Admitting: Cardiovascular Disease
